# Patient Record
Sex: Male | Born: 1980 | Race: White | Hispanic: No | Marital: Single | Smoking: Current every day smoker
Health system: Southern US, Community
[De-identification: ages and names within clinical notes are randomized; demographics above are authoritative.]

## PROBLEM LIST (undated history)

## (undated) DIAGNOSIS — R4781 Slurred speech: Secondary | ICD-10-CM

## (undated) DIAGNOSIS — R569 Unspecified convulsions: Secondary | ICD-10-CM

## (undated) DIAGNOSIS — S069X9A Unspecified intracranial injury with loss of consciousness of unspecified duration, initial encounter: Secondary | ICD-10-CM

## (undated) DIAGNOSIS — F319 Bipolar disorder, unspecified: Secondary | ICD-10-CM

## (undated) DIAGNOSIS — S069XAA Unspecified intracranial injury with loss of consciousness status unknown, initial encounter: Secondary | ICD-10-CM

## (undated) HISTORY — PX: COSMETIC SURGERY: SHX468

## (undated) HISTORY — DX: Bipolar disorder, unspecified: F31.9

## (undated) HISTORY — PX: OTHER SURGICAL HISTORY: SHX169

## (undated) HISTORY — DX: Unspecified intracranial injury with loss of consciousness of unspecified duration, initial encounter: S06.9X9A

## (undated) HISTORY — DX: Unspecified intracranial injury with loss of consciousness status unknown, initial encounter: S06.9XAA

## (undated) HISTORY — DX: Slurred speech: R47.81

---

## 2002-02-10 DIAGNOSIS — F319 Bipolar disorder, unspecified: Secondary | ICD-10-CM

## 2002-02-10 HISTORY — PX: BRAIN SURGERY: SHX531

## 2002-02-10 HISTORY — DX: Bipolar disorder, unspecified: F31.9

## 2002-03-15 ENCOUNTER — Emergency Department (HOSPITAL_COMMUNITY): Admission: EM | Admit: 2002-03-15 | Discharge: 2002-03-15 | Payer: Self-pay | Admitting: Emergency Medicine

## 2002-05-07 ENCOUNTER — Encounter: Payer: Self-pay | Admitting: General Surgery

## 2002-05-07 ENCOUNTER — Encounter: Payer: Self-pay | Admitting: Neurosurgery

## 2002-05-07 ENCOUNTER — Inpatient Hospital Stay (HOSPITAL_COMMUNITY): Admission: AC | Admit: 2002-05-07 | Discharge: 2002-05-31 | Payer: Self-pay

## 2002-05-08 ENCOUNTER — Encounter: Payer: Self-pay | Admitting: Neurosurgery

## 2002-05-09 ENCOUNTER — Encounter: Payer: Self-pay | Admitting: General Surgery

## 2002-05-10 ENCOUNTER — Encounter: Payer: Self-pay | Admitting: Neurosurgery

## 2002-05-11 ENCOUNTER — Encounter: Payer: Self-pay | Admitting: General Surgery

## 2002-05-14 ENCOUNTER — Encounter: Payer: Self-pay | Admitting: General Surgery

## 2002-05-18 ENCOUNTER — Encounter: Payer: Self-pay | Admitting: General Surgery

## 2002-05-24 ENCOUNTER — Encounter: Payer: Self-pay | Admitting: General Surgery

## 2002-05-24 ENCOUNTER — Encounter: Payer: Self-pay | Admitting: Neurosurgery

## 2002-05-31 ENCOUNTER — Inpatient Hospital Stay (HOSPITAL_COMMUNITY)
Admission: RE | Admit: 2002-05-31 | Discharge: 2002-07-21 | Payer: Self-pay | Admitting: Physical Medicine & Rehabilitation

## 2002-06-03 ENCOUNTER — Encounter: Payer: Self-pay | Admitting: Physical Medicine & Rehabilitation

## 2002-06-05 ENCOUNTER — Encounter: Payer: Self-pay | Admitting: Physical Medicine & Rehabilitation

## 2002-06-06 ENCOUNTER — Encounter: Payer: Self-pay | Admitting: Physical Medicine & Rehabilitation

## 2002-06-22 ENCOUNTER — Encounter: Payer: Self-pay | Admitting: Physical Medicine & Rehabilitation

## 2002-07-18 ENCOUNTER — Encounter: Payer: Self-pay | Admitting: Physical Medicine & Rehabilitation

## 2002-07-26 ENCOUNTER — Encounter
Admission: RE | Admit: 2002-07-26 | Discharge: 2002-09-23 | Payer: Self-pay | Admitting: Physical Medicine & Rehabilitation

## 2002-08-23 ENCOUNTER — Encounter
Admission: RE | Admit: 2002-08-23 | Discharge: 2002-11-21 | Payer: Self-pay | Admitting: Physical Medicine & Rehabilitation

## 2002-09-02 ENCOUNTER — Encounter: Admission: RE | Admit: 2002-09-02 | Discharge: 2002-09-02 | Payer: Self-pay | Admitting: Neurosurgery

## 2002-09-02 ENCOUNTER — Encounter: Payer: Self-pay | Admitting: Neurosurgery

## 2002-12-06 ENCOUNTER — Encounter
Admission: RE | Admit: 2002-12-06 | Discharge: 2003-03-06 | Payer: Self-pay | Admitting: Physical Medicine & Rehabilitation

## 2003-02-17 ENCOUNTER — Ambulatory Visit (HOSPITAL_BASED_OUTPATIENT_CLINIC_OR_DEPARTMENT_OTHER): Admission: RE | Admit: 2003-02-17 | Discharge: 2003-02-17 | Payer: Self-pay | Admitting: Ophthalmology

## 2003-05-18 ENCOUNTER — Encounter
Admission: RE | Admit: 2003-05-18 | Discharge: 2003-08-16 | Payer: Self-pay | Admitting: Physical Medicine & Rehabilitation

## 2003-06-05 ENCOUNTER — Encounter: Admission: RE | Admit: 2003-06-05 | Discharge: 2003-09-03 | Payer: Self-pay

## 2003-08-17 ENCOUNTER — Encounter
Admission: RE | Admit: 2003-08-17 | Discharge: 2003-11-15 | Payer: Self-pay | Admitting: Physical Medicine & Rehabilitation

## 2003-11-20 ENCOUNTER — Ambulatory Visit: Payer: Self-pay | Admitting: Psychology

## 2003-11-20 ENCOUNTER — Encounter: Admission: RE | Admit: 2003-11-20 | Discharge: 2004-02-18 | Payer: Self-pay | Admitting: Psychology

## 2003-11-23 ENCOUNTER — Encounter
Admission: RE | Admit: 2003-11-23 | Discharge: 2004-01-22 | Payer: Self-pay | Admitting: Physical Medicine & Rehabilitation

## 2003-11-27 ENCOUNTER — Ambulatory Visit: Payer: Self-pay | Admitting: Physical Medicine & Rehabilitation

## 2003-12-21 ENCOUNTER — Ambulatory Visit: Payer: Self-pay | Admitting: Psychology

## 2004-01-11 ENCOUNTER — Ambulatory Visit: Payer: Self-pay | Admitting: Family Medicine

## 2004-01-18 ENCOUNTER — Ambulatory Visit: Payer: Self-pay | Admitting: Psychology

## 2004-01-22 ENCOUNTER — Encounter
Admission: RE | Admit: 2004-01-22 | Discharge: 2004-04-21 | Payer: Self-pay | Admitting: Physical Medicine & Rehabilitation

## 2004-01-24 ENCOUNTER — Ambulatory Visit: Payer: Self-pay | Admitting: Physical Medicine & Rehabilitation

## 2004-02-22 ENCOUNTER — Ambulatory Visit: Payer: Self-pay | Admitting: Family Medicine

## 2004-04-10 ENCOUNTER — Ambulatory Visit: Payer: Self-pay | Admitting: Family Medicine

## 2004-04-16 ENCOUNTER — Encounter
Admission: RE | Admit: 2004-04-16 | Discharge: 2004-07-15 | Payer: Self-pay | Admitting: Physical Medicine & Rehabilitation

## 2004-04-16 ENCOUNTER — Ambulatory Visit: Payer: Self-pay | Admitting: Physical Medicine & Rehabilitation

## 2004-05-22 ENCOUNTER — Ambulatory Visit: Payer: Self-pay | Admitting: Family Medicine

## 2004-05-29 ENCOUNTER — Ambulatory Visit: Payer: Self-pay | Admitting: Physical Medicine & Rehabilitation

## 2004-07-15 ENCOUNTER — Ambulatory Visit: Payer: Self-pay | Admitting: Family Medicine

## 2004-07-18 ENCOUNTER — Encounter: Admission: RE | Admit: 2004-07-18 | Discharge: 2004-10-16 | Payer: Self-pay | Admitting: Psychology

## 2004-07-18 ENCOUNTER — Ambulatory Visit: Payer: Self-pay | Admitting: Psychology

## 2004-09-12 ENCOUNTER — Ambulatory Visit: Payer: Self-pay | Admitting: Family Medicine

## 2004-09-23 ENCOUNTER — Ambulatory Visit: Payer: Self-pay | Admitting: Physical Medicine & Rehabilitation

## 2004-09-23 ENCOUNTER — Encounter
Admission: RE | Admit: 2004-09-23 | Discharge: 2004-12-22 | Payer: Self-pay | Admitting: Physical Medicine & Rehabilitation

## 2005-03-16 ENCOUNTER — Emergency Department (HOSPITAL_COMMUNITY): Admission: EM | Admit: 2005-03-16 | Discharge: 2005-03-16 | Payer: Self-pay | Admitting: Emergency Medicine

## 2005-04-30 ENCOUNTER — Ambulatory Visit: Payer: Self-pay | Admitting: Family Medicine

## 2005-06-04 ENCOUNTER — Emergency Department (HOSPITAL_COMMUNITY): Admission: EM | Admit: 2005-06-04 | Discharge: 2005-06-04 | Payer: Self-pay | Admitting: Emergency Medicine

## 2007-02-08 ENCOUNTER — Ambulatory Visit: Payer: Self-pay | Admitting: Family Medicine

## 2007-02-11 ENCOUNTER — Encounter: Payer: Self-pay | Admitting: Family Medicine

## 2007-05-11 ENCOUNTER — Ambulatory Visit: Payer: Self-pay | Admitting: Family Medicine

## 2007-05-19 DIAGNOSIS — F17200 Nicotine dependence, unspecified, uncomplicated: Secondary | ICD-10-CM | POA: Insufficient documentation

## 2007-05-19 DIAGNOSIS — R259 Unspecified abnormal involuntary movements: Secondary | ICD-10-CM | POA: Insufficient documentation

## 2007-05-19 DIAGNOSIS — F329 Major depressive disorder, single episode, unspecified: Secondary | ICD-10-CM

## 2007-05-19 DIAGNOSIS — E669 Obesity, unspecified: Secondary | ICD-10-CM

## 2007-05-19 DIAGNOSIS — F909 Attention-deficit hyperactivity disorder, unspecified type: Secondary | ICD-10-CM | POA: Insufficient documentation

## 2007-05-19 DIAGNOSIS — I10 Essential (primary) hypertension: Secondary | ICD-10-CM

## 2007-08-19 ENCOUNTER — Encounter: Payer: Self-pay | Admitting: Family Medicine

## 2007-08-19 LAB — CONVERTED CEMR LAB
BUN: 13 mg/dL (ref 6–23)
Chloride: 106 meq/L (ref 96–112)
Cholesterol: 178 mg/dL (ref 0–200)
Eosinophils Absolute: 0.2 10*3/uL (ref 0.0–0.7)
Eosinophils Relative: 3 % (ref 0–5)
HCT: 46 % (ref 39.0–52.0)
Hemoglobin: 14.7 g/dL (ref 13.0–17.0)
LDL Cholesterol: 80 mg/dL (ref 0–99)
MCHC: 32 g/dL (ref 30.0–36.0)
MCV: 91.1 fL (ref 78.0–100.0)
Monocytes Relative: 16 % — ABNORMAL HIGH (ref 3–12)
Neutro Abs: 3.6 10*3/uL (ref 1.7–7.7)
RBC: 5.05 M/uL (ref 4.22–5.81)
Sodium: 140 meq/L (ref 135–145)
Total CHOL/HDL Ratio: 7.1
Triglycerides: 363 mg/dL — ABNORMAL HIGH (ref <150)
VLDL: 73 mg/dL — ABNORMAL HIGH (ref 0–40)

## 2007-09-09 ENCOUNTER — Ambulatory Visit: Payer: Self-pay | Admitting: Family Medicine

## 2007-09-19 ENCOUNTER — Emergency Department (HOSPITAL_COMMUNITY): Admission: EM | Admit: 2007-09-19 | Discharge: 2007-09-19 | Payer: Self-pay | Admitting: Emergency Medicine

## 2007-09-24 ENCOUNTER — Emergency Department (HOSPITAL_COMMUNITY): Admission: EM | Admit: 2007-09-24 | Discharge: 2007-09-24 | Payer: Self-pay | Admitting: Emergency Medicine

## 2010-03-12 NOTE — Letter (Signed)
Summary: RPC chart  RPC chart   Imported By: Curtis Sites 09/18/2009 11:19:23  _____________________________________________________________________  External Attachment:    Type:   Image     Comment:   External Document
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
## 2010-06-28 NOTE — Op Note (Signed)
NAME:  Justin Russo, Justin Russo                         ACCOUNT NO.:  [id number]   MEDICAL RECORD NO.:  [id number]                   PATIENT TYPE:  AMB   LOCATION:  DSC                                  FACILITY:  [facility]   PHYSICIAN:  Pasty Spillers. Maple Hudson, M.D.              DATE OF BIRTH:  1980-09-14   DATE OF PROCEDURE:  DATE OF DISCHARGE:                                 OPERATIVE REPORT   PREOPERATIVE DIAGNOSIS:  Exotropia.   POSTOPERATIVE DIAGNOSIS:  Exotropia.   PROCEDURE:  1. Lateral rectus muscle recession, 10.5 mm O.S.  2. Medial rectus muscle resection, 7.5 mm O.S.   SURGEON:  Pasty Spillers. Maple Hudson, M.D.   ANESTHESIA:  General (laryngeal mask)   COMPLICATIONS:  None.   DESCRIPTION OF PROCEDURE:  After routine preoperative evaluation including  informed consent from the patient and parent, the patient was taken to the  operating room where he was identified immediately.  General anesthesia  introduced without difficulty.  After placement of the appropriate monitors,  the patient was prepped and draped in a sterile fashion. A lid speculum was  placed in the left eye.   Through a mid _____________ temporal fornix incision through conjunctiva and  Tenon fascia the left lateral rectus muscle was engaged on a series of  muscles hooks and carefully cleared of its surrounding fascial attachments.  The tendon was secured with a double-armed 6-0 Vicryl suture and with a  double-lock divided each border of the muscle, 1 mm from the insertion. The  muscle was disinserted. It was reattached to the sclera in a measured  distance of 10.5 mm posterior to the original insertion, using direct  scleral passage in crossed-swords fashion.  The suture ends were tied  securely after the position of the muscle had been checked and found to be  accurate.  Conjunctiva was closed with 2 interrupted 6-0 Vicryl sutures.   Through an inferonasal fornix incision through conjunctiva and Tenon fascia  the left medial  rectus muscle was identified, engaged, and cleared as  described for the lateral.  The muscle was spread between 2 self-retaining  hooks.  A 2-mm bite was taken of the _________ muscle belly in a measured  distance of 7.5 mm posterior to the insertion.  The knot was tied securely  at this location.  The needle in each end of the double-armed suture was  passed from the center of the muscle belly to the periphery, 7.5 mm  posterior to the insertion, and a double-lock divider was placed at each  border of the muscle.  A resection clamp was placed on the muscle just  anterior to these sutures.  The muscle was disinserted.  Each pole suture  was passed posteriorly-to-anteriorly through the periphery of the stump and  then anteriorly-to-posteriorly near the center of the stump, and then  posteriorly-to-anteriorly through the center of the muscle belly, just  posteriorly to the briefly placed  knot.  The muscle was drawn up to the  level of the original insertion and all slack was removed before the suture  ends were tied securely.   The conjunctiva was closed with 2 interrupted 6-0 Vicryl sutures.  TobraDex  ointment was placed in the eye.  The patient was awakened without difficulty  and taken to the recovery room in stable condition having suffered no  intraoperative nor immediately postoperative complications.                                               Pasty Spillers. Maple Hudson, M.D.    Cheron Schaumann  D:  02/17/2003  T:  02/17/2003  Job:  [id number]
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
## 2010-06-28 NOTE — Assessment & Plan Note (Signed)
Justin Russo is back regarding his brain injury and behavioral dyscontrol  syndrome.  We switched him over to valproic acid which seems to have worked  nicely to control his mood.  It is probably the best it has been since he  has been with [place].  He has had some problems with further gaining weight.  He  denies any significant pain.  He occasionally will  have a headache.  [name]  has questions about driving at this point and the driving is somewhat he  really wants to resume.  He did have an episode where he ran out of  medications a week or so ago, but it turned out that the pharmacy had given  them the wrong number of pills.   SOCIAL HISTORY:  The patient continues to live with his parents.  He does  smoke periodically.   REVIEW OF SYSTEMS:  The patient reports shortness of breath, coughing,  problems with mood and sleep at times, but this has improved.  Hesitation is  down.  Does report  reflux and heartburn occasionally.   PHYSICAL EXAMINATION:  VITAL SIGNS:  Blood pressure is 136/76, pulse is 75,  respiratory rate is 18, saturating 97% on room air.  NEUROLOGIC:  The patient walks with normal gait. Affect is bright and  appropriate.  Appearance is well-kept.  He has gained some weight.  He is  much more controlled in his demeanor and asks appropriate questions today.  Gaze is fairly conjugate.  His cranial nerve exam is stable and  unremarkable.  Sensory exam is 2/2 and motor exam is 5/5.  HEART:  Regular rate and rhythm.  LUNGS:  Clear.  ABDOMEN:  Soft and nontender.   ASSESSMENT:  1.  Traumatic brain injury.  2.  Behavioral dyscontrol syndrome.  3.  History of gaze deviation.  4.  Myoclonus.   PLAN:  1.  Taking Risperdal 1 mg t.i.d.  2.  Continue valproic acid 250 mg t.i.d.  We will check low LFTs and      valproic acid level today.  3.  Continue Restoril 30 mg for sleep.  4.  Protonix for reflux.  5.  The patient is not given permission to drive at this point.  We will let     him do some trial runs with his family if they would like in secluded      areas.  He is too impulsive in attention still to allow him to drive at      this point.      ZTS/MedQ  D:  02/26/2004 14:13:50  T:  02/26/2004 14:31:17  Job #:  [id number]

## 2010-06-28 NOTE — Assessment & Plan Note (Signed)
HISTORY:  The patient is back regarding his TBI and behavioral dyscontrol.  He has done poorly after coming off the Risperdal.  He remains on  propranolol 20 mg daily and 1000 mg total of Tegretol, in addition to his  Prozac 40 mg daily.  The parents note impulsive behavior with aggressive  activity.  He is very distractible.  He acts very child-like at times and  aggressive at others.  They did not call regarding these concerns to the  office, however, and waited to discuss them today.  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  He has no suicidal thoughts.  His mother has been  concerned for her well-being, however, at times due to his impulsive and  aggressive behavior.  They are essentially afraid to bring him out in public  due to the mood swings.   REVIEW OF SYSTEMS:  The patient denies any chest pain, shortness of breath,  cold, flu, wheezing, or coughing symptoms.  The patient denies weakness,  numbness, spasms, strokes, confusion, problems with sleep or headaches.  He  does report some suicidal thoughts previously.  He denies nausea, vomiting,  constipation, bowel or bladder incontinence.  He denies fever, chills,  weight changes, or bruising.   PHYSICAL EXAMINATION:  VITAL SIGNS:  Blood pressure 95/51, pulse 66.  GENERAL:  He is walking with a limp.  He is generally alert, with a normal  appearance.  NEUROLOGIC:  He had good balance and coordination today for the most part.  Strength is 5/5.  Gaze is more conjugate than in the past.  Sensory  examination is normal.  Cranial nerve examination is stable.  The patient  was very impulsive today, continually interrupting conservation, and at  times speaking inappropriately and child-like.  He was redirectable,  however.   ASSESSMENT:  1. Status post traumatic brain injury.  2. Behavioral dyscontrol syndrome.  3. History of gaze deviation.  4.  Myoclonus and dystonia.   PLAN:  1. We will restart Risperdal at 0.25 mg q.12h., increasing up to 0.5 mg     q.12h. after one week, if this is not beneficial.  Hopefully this will     better control his mood swings.  He may need to stay on this long-term.  2. The patient will continue with Prozac, propranolol and Tegretol for now.     Consider changing the Tegretol at some point, depending upon his response     to the Risperdal.  3. Will continue the propranolol for his tremor at this point, as this has     been effective here.  4. Will continue his Prozac for now, but may consider another agent down the     line.  5. I will see the patient back in about one month's time.      Ranelle Oyster, M.D.   ZTS/MedQ  D:  08/21/2003 14:59:52  T:  08/21/2003 15:36:28  Job #:  [id number]

## 2010-06-28 NOTE — Assessment & Plan Note (Signed)
MEDICAL RECORD NUMBER:  [id number]   Justin Russo is here in followup of his traumatic brain injury. The last time I  saw him he had increased sleepiness with lethargy with tremors. We did a lab  workup at that time which revealed normal liver function.  His valproic acid  level was actually a bit low at 39.2. Urine drug screen was positive for  marijuana. CBC was normal. The patient comes in stating that he feels better  after we decrease his Risperdal a bit. We discussed his drug habits and the  patient states that he smokes about a 1/4 bag of weed every day. He gets the  drugs from his friends. He pays for the drugs from an allowance his mother  gives him. His mother states that she was planning to talk about the drug  today.  He remains on propranolol 20 mg b.i.d., Risperdal 0.5 mg t.i.d.,  Restoril 30 mg q.h.s., Prozac 60 mg q.d. and Depakote 250 mg t.i.d.  Protonix 40 mg q.d.   REVIEW OF SYMPTOMS:  The patient notes intermittent tremors. He still is  lethargic but is more alert. He denies any nausea, vomiting, diarrhea,  constipation, bowel or bladder incontinence. Headaches are few and far  between. Sleep is fair to normal.   SOCIAL HISTORY:  The patient's drug habits are mentioned above. The patient  states he really has not goal or direction at this point in his life.   PHYSICAL EXAMINATION:  The patient's alert, vital signs are stable. He is  afebrile.  The patient's gait is slightly wide based. He tends to lean  posteriorly. Affect remains flat although he is more arousable and focused  today. Appearance is fairly well kept. He has intermittent essential and  intentional tremor. Strength is 5/5, reflexes are decreased. Sensory exam is  fair to compared to normal. Visual exam and gaze are fair with some  disconjugate movement noted.   ASSESSMENT:  1.  Traumatic brain injury.  2.  Behavioral dyscontrol.  3.  History of gaze deviation.  4.  Myoclonus.  5.  Marijuana abuse.   PLAN:  1.  We had a long heart to heart talk regarding his drug use. We discussed      the ramifications of using marijuana in general let alone in the setting      of antipsychotic medications and his severe traumatic brain injury. I      will make a referral to outpatient behavioral health for drug evaluation      and treatments. I think he could be treated on an outpatient basis but      he may require an inpatient program. I am not sure this option is      available to him with his Medicaid. We will check his urine in three      months when I see him back in followup. If he tests positive again for      marijuana, we will discharge him from the clinic. The patient was warned      as well as his mother.  2.  I refilled his protonix today at 40 mg q.d. and Restoril 30 mg q.h.s.      He will remain on Depakote 250 mg t.i.d., Risperdal 0.5 mg t.i.d. and      Prozac 50 mg q.d. with the propranolol being at 20 mg b.i.d.  3.  I will see the patient back in three months as mentioned above.     ZTS/MedQ  D:  05/29/2004 11:36:07  T:  05/29/2004 12:08:33  Job #:  [id number]
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
## 2010-06-28 NOTE — Op Note (Signed)
NAME:  Justin Russo, Justin Russo                         ACCOUNT NO.:  [id number]   MEDICAL RECORD NO.:  [id number]                   PATIENT TYPE:  INP   LOCATION:  3114                                 FACILITY:  [facility]   PHYSICIAN:  Jimmye Norman III, M.D.               DATE OF BIRTH:  18-Dec-1980   DATE OF PROCEDURE:  05/16/2002  DATE OF DISCHARGE:                                 OPERATIVE REPORT   PREOPERATIVE DIAGNOSIS:  Severe closed head injury with LeFort 2 and 3  fractures of the mid face.   POSTOPERATIVE DIAGNOSIS:  Severe closed head injury with LeFort 2 and 3  fractures of the mid face, requiring ventilatory and nutritional support  long-term.   PROCEDURE:  1. Percutaneous endoscopic  gastrostomy tube.  2. Percutaneous tracheostomy with a #8 Shiley tracheostomy tube.   SURGEON:  Jimmye Norman, M.D.   ASSISTANT:  Gabrielle Dare. Janee Morn, M.D.   ANESTHESIA:  General endotracheal anesthesia.   ESTIMATED BLOOD LOSS:  Less than  20 cc.   COMPLICATIONS:  None, condition stable.   DISPOSITION:  He is going to go back to the ICU directly after he has had  open reduction and internal fixation of his LeFort fractures. That will be  separately dictated by Dr. Alita Chyle.   DESCRIPTION OF PROCEDURE:  The patient was brought to the operating room and  placed in the supine position on the operating table. After he was given an  adequate amount of IV sedation and paralyzation. He was  prepped  for the  percutaneous endoscopic gastrostomy tube initially.   The Olympus endoscope pediatric size was passed into the oropharynx down  through the patient's esophagus and into the proximal stomach. There was a  tube feeding bezoar in the proximal stomach, however, we were able to see  the anterior abdominal wall clearly, through which a 14 gauge angiocath was  passed and subsequently a looped wire passed through the angiocath through  the snare that had been passed through the endoscope and brought  out through  the patient's mouth.   Once we had done this we looped the pullthrough gastrostomy tube which was a  24 [place] size around the wire and pulled it back through the patient's  mouth out the anterior abdominal wall and secured it in place in the usual  standard manner. We re-endoscoped the patient to confirm anterior gastric  positioning of the gastrostomy tube bolster, and pictures were taken to  confirm this. Once this was done and the G-tube was in place, we  subsequently placed the towel roll and repositioned the shoulder in order to  prepare him for the tracheostomy tube.   About 1 cm above the sternal notch a transverse incision was made about 2 cm  in size down into the subcu. Electrocautery was used to dissect down through  the subcu. We then bluntly dissected down through the pretracheal fascia  using hemostat clamps and making sure that the strap muscles were split down  the middle.   We kept the trachea in the midline as we subsequently penetrated the second  to third tracheal rings with the angiocatheter and subsequently passed a  wire from the [place] percutaneous tracheostomy tube down into the  trachea. It was confirmed to be in position by a bronchoscopy performed by  the anesthesiologist.   Once this was done a rigid short dilator was passed over the wire into the  tracheotomy and subsequently the [facility] Rhino large dilator was passed over  the wire and introducer into the tracheotomy enlarging it to the proper  size. This allowed for [place] to subsequently pass a #8 Shiley catheter  introduced in the tracheotomy and then down into position. Once this was  done the balloon was inflated. It was secured in place with 3-0 nylon  sutures in 4 corners.   Once  this was completed and a dressing was in place, the patient was  maintained in the operating room for fixation of  his facial fracture by Dr.  Alita Chyle. All sponge, instrument and needle counts were  correct. The  patient was left in the operating room.                                               Kathrin Ruddy, M.D.    JW/MEDQ  D:  05/16/2002  T:  05/16/2002  Job:  [id number]

## 2010-06-28 NOTE — Assessment & Plan Note (Signed)
HISTORY:  Justin Russo is here in followup of his brain injury.  The mother notes  that over the last two weeks or so that he has become more sleepy and  lethargic. She notes tremors.  He has had more double vision.  He continues  to smoke cigarettes.  He does some volunteer work at the [employer], but  does not like the work.  There have been no changes in medications.  The  patient and his mother state that he has been taking his medications as I  have prescribed them.   MEDICATIONS:  1.  The patient is on Depakote 250 mg t.i.d.  2.  Prozac 60 mg daily.  3.  Protonix daily.  4.  Restoril 30 mg q.h.s.  5.  Risperdal 1 mg t.i.d.  6.  Propranolol 20 mg b.i.d.   SOCIAL HISTORY:  Pertinent issues are mentioned above.   REVIEW OF SYSTEMS:  The patient reports fair sleep.  Hearing is impaired.  He has remote spasms.  He has a few headaches.  He denies any nausea,  vomiting, diarrhea, constipation, bowel or bladder incontinence.   PHYSICAL EXAMINATION:  VITAL SIGNS:  Blood pressure 120/64, pulse 70,  respirations 18, saturation 97% on room air.  NEUROLOGIC:  The patient's gait is slightly wide-based.  His affect is flat  and slow.  He is very slow to process today.  His appearance is fairly well  kempt.  The patient has a tremor both with rest and with movement today.  The tongue shakes as well.  He is able to follow most commands; however,  strength is 5/5.  Reflexes are decreased.  Sensory exam is intact.  Cranial  nerve exam reveals decreased acuity and double vision, particularly on gaze  to the left side.  His gaze is still disconjugate but without significant  change from prior visit.   ASSESSMENT:  1.  Traumatic brain injury.  2.  Behavioral dyscontrol.  3.  History of gaze deviation.  4.  Myoclonus.  5.  New tremors, most consistent with dyskinesias.  These are likely related      to his Risperdal.   PLAN:  1.  Will decrease Risperdal to 0.5 mg t.i.d.  We may have to  discontinue      this entirely, but due to his chronic behavioral difficulties, I would      like to see how Justin Russo does with this slightly reduced dose.  An option      would be to titrate his valproic acid level higher.  2.  Check a valproic acid level today, as well as CBC, complete metabolic      panel and urine drug screen.  3.  He may continue his medications as dosed for now, except for the      Risperdal as mentioned above.  4.  I will see the patient back in approximately one month's time.     ZTS/MedQ  D:  04/16/2004 12:42:13  T:  04/16/2004 13:33:43  Job #:  [id number]

## 2010-06-28 NOTE — Assessment & Plan Note (Signed)
HISTORY OF PRESENT ILLNESS:  Justin Russo is back regarding his brain injury and  behavioral dyscontrol syndrome.  He is still having some spells where he  becomes agitated and unruly.  These seem to be happening more during the  p.m. hours.  He has improved overall, but he is not where he wants to be.  He maintains on Tegretol 1000 mg a day.  Sleep is fair.  Justin Russo has gained  some weight.  He does smoke some cigarettes now.  He smokes up to half pack  per day.   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   REVIEW OF SYSTEMS:  The patient denies any chest pain.  Does report  occasional shortness of breath and coughing.  He does report acid reflux  which has been chronic but seems to be worsened of late.  He does report  occasional depression, blurred vision, agitation, suicidal thoughts.  Positive weight gain as noted as well as occasionally high blood sugars.   PHYSICAL EXAMINATION:  VITAL SIGNS:  Blood pressure 119/68, pulse 74,  respirations 20, saturating 98% on room air.  GENERAL APPEARANCE:  Gait is normal.  Affect is fair.  He remains a bit  loose with his affect, but this is mostly appropriate.  He can be impulsive  at times.  Appearance is well kept.  NEUROLOGICAL:  Strength is 5/5.  Sensory exam is intact as well as reflexes.  Cranial nerve exam is generally stable.  His gaze remains slightly  disconjugate, but he denies double vision.   ASSESSMENT:  1.  Traumatic brain injury.  2.  Behavioral dyscontrol syndrome.  3.  History of gaze deviation.  4.  Myoclonus.   PLAN:  1.  Will continue Risperdal at 1 mg t.i.d.  2.  Will transition all Tegretol to valproic acid.  Taper instruction sheet      was written.  We will workup towards 250 mg t.i.d. of valproic acid.      Will continue his Restoril at 30 mg q.h.s. for sleep.  3.  I gave the patient a prescription for Protonix for his reflux.  4.  Will see the  patient back in about a months time.  He will need LFT's      and valproic acid level at that visit.      Justin Russo   ZTS/MedQ  D:  01/24/2004 10:31:33  T:  01/24/2004 10:58:16  Job #:  [id number]
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
## 2010-10-10 ENCOUNTER — Emergency Department (HOSPITAL_COMMUNITY): Payer: Medicare Other

## 2010-10-10 ENCOUNTER — Emergency Department (HOSPITAL_COMMUNITY)
Admission: EM | Admit: 2010-10-10 | Discharge: 2010-10-10 | Disposition: A | Discharge status: Home / Self Care | Payer: Medicare Other | Attending: Emergency Medicine | Admitting: Emergency Medicine

## 2010-10-10 DIAGNOSIS — W2209XA Striking against other stationary object, initial encounter: Secondary | ICD-10-CM | POA: Insufficient documentation

## 2010-10-10 DIAGNOSIS — M79609 Pain in unspecified limb: Secondary | ICD-10-CM | POA: Insufficient documentation

## 2010-10-10 DIAGNOSIS — S92309A Fracture of unspecified metatarsal bone(s), unspecified foot, initial encounter for closed fracture: Secondary | ICD-10-CM | POA: Insufficient documentation

## 2010-10-10 DIAGNOSIS — M7989 Other specified soft tissue disorders: Secondary | ICD-10-CM | POA: Insufficient documentation

## 2010-10-10 DIAGNOSIS — S6990XA Unspecified injury of unspecified wrist, hand and finger(s), initial encounter: Secondary | ICD-10-CM | POA: Insufficient documentation

## 2011-03-25 ENCOUNTER — Telehealth: Payer: Self-pay | Admitting: Family Medicine
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
## 2011-03-25 NOTE — Telephone Encounter (Signed)
He needs an appt here has not been seen for years

## 2011-03-27 ENCOUNTER — Telehealth: Payer: Self-pay | Admitting: Family Medicine

## 2011-03-27 NOTE — Telephone Encounter (Signed)
Will need note from his last neurologist to even see what he was being treated for. I have not seen this pt for over 3 years, he needs a n appt first, which would be like a new pt if he has not been here in 3 years

## 2011-03-28 ENCOUNTER — Telehealth: Payer: Self-pay | Admitting: Family Medicine

## 2011-03-28 NOTE — Telephone Encounter (Signed)
Pt has appt with dr. Jeanice Lim

## 2011-03-28 NOTE — Telephone Encounter (Signed)
Spoke directly with patient's father who states the symptoms were 37 days old, of slurred speech and localized unilateral weakness, I advised he take him to the ED for further evaluation today, he agreed

## 2011-03-28 NOTE — Telephone Encounter (Signed)
Left message for Justin Russo 

## 2011-03-31 ENCOUNTER — Ambulatory Visit (INDEPENDENT_AMBULATORY_CARE_PROVIDER_SITE_OTHER): Payer: Medicare Other | Admitting: Family Medicine

## 2011-03-31 ENCOUNTER — Encounter: Payer: Self-pay | Admitting: Family Medicine

## 2011-03-31 VITALS — BP 130/84 | HR 78 | Resp 16 | Ht 74.25 in | Wt 156.4 lb

## 2011-03-31 DIAGNOSIS — I10 Essential (primary) hypertension: Secondary | ICD-10-CM

## 2011-03-31 DIAGNOSIS — R0989 Other specified symptoms and signs involving the circulatory and respiratory systems: Secondary | ICD-10-CM | POA: Diagnosis not present

## 2011-03-31 DIAGNOSIS — F3289 Other specified depressive episodes: Secondary | ICD-10-CM

## 2011-03-31 DIAGNOSIS — F17200 Nicotine dependence, unspecified, uncomplicated: Secondary | ICD-10-CM

## 2011-03-31 DIAGNOSIS — R4789 Other speech disturbances: Secondary | ICD-10-CM | POA: Diagnosis not present

## 2011-03-31 DIAGNOSIS — E785 Hyperlipidemia, unspecified: Secondary | ICD-10-CM | POA: Diagnosis not present

## 2011-03-31 DIAGNOSIS — F319 Bipolar disorder, unspecified: Secondary | ICD-10-CM | POA: Insufficient documentation

## 2011-03-31 DIAGNOSIS — R4781 Slurred speech: Secondary | ICD-10-CM

## 2011-03-31 DIAGNOSIS — F329 Major depressive disorder, single episode, unspecified: Secondary | ICD-10-CM | POA: Diagnosis not present

## 2011-03-31 DIAGNOSIS — E669 Obesity, unspecified: Secondary | ICD-10-CM | POA: Diagnosis not present

## 2011-03-31 DIAGNOSIS — E559 Vitamin D deficiency, unspecified: Secondary | ICD-10-CM | POA: Diagnosis not present

## 2011-03-31 DIAGNOSIS — Z79899 Other long term (current) drug therapy: Secondary | ICD-10-CM | POA: Diagnosis not present

## 2011-03-31 DIAGNOSIS — M810 Age-related osteoporosis without current pathological fracture: Secondary | ICD-10-CM | POA: Diagnosis not present

## 2011-03-31 DIAGNOSIS — D649 Anemia, unspecified: Secondary | ICD-10-CM | POA: Diagnosis not present

## 2011-03-31 NOTE — Assessment & Plan Note (Signed)
Unchanged, no plan to quit 
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
## 2011-03-31 NOTE — Assessment & Plan Note (Signed)
Managed by psychiatry 

## 2011-03-31 NOTE — Patient Instructions (Addendum)
F/u in  6months.  You are being referred to neurology office that you have been to before for new slurred  speech  Cbc, chem 7, lipid, tSH and vit D today , non fasting  Please cut back on cigarretes

## 2011-03-31 NOTE — Assessment & Plan Note (Signed)
Carotid doppler to evaluate possible bruit also recent h/o speech disturbance and unilateral numbness
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
## 2011-03-31 NOTE — Assessment & Plan Note (Signed)
Controlled, no change in medication  

## 2011-04-01 ENCOUNTER — Ambulatory Visit: Payer: Medicare Other | Admitting: Family Medicine

## 2011-04-01 LAB — LIPID PANEL
Cholesterol: 168 mg/dL (ref 0–200)
HDL: 35 mg/dL — ABNORMAL LOW (ref >39)
LDL Cholesterol: 62 mg/dL (ref 0–99)
Triglycerides: 354 mg/dL — ABNORMAL HIGH (ref <150)

## 2011-04-01 LAB — BASIC METABOLIC PANEL
BUN: 16 mg/dL (ref 6–23)
CO2: 26 mEq/L (ref 19–32)
Potassium: 4.5 mEq/L (ref 3.5–5.3)
Sodium: 141 mEq/L (ref 135–145)

## 2011-04-01 LAB — VITAMIN D 25 HYDROXY (VIT D DEFICIENCY, FRACTURES): Vit D, 25-Hydroxy: 28 ng/mL — ABNORMAL LOW (ref 30–89)

## 2011-04-01 LAB — CBC
HCT: 47 % (ref 39.0–52.0)
MCH: 29 pg (ref 26.0–34.0)
MCV: 89 fL (ref 78.0–100.0)

## 2011-04-03 DIAGNOSIS — G40109 Localization-related (focal) (partial) symptomatic epilepsy and epileptic syndromes with simple partial seizures, not intractable, without status epilepticus: Secondary | ICD-10-CM | POA: Diagnosis not present

## 2011-04-03 DIAGNOSIS — Z87820 Personal history of traumatic brain injury: Secondary | ICD-10-CM | POA: Diagnosis not present

## 2011-04-03 DIAGNOSIS — R259 Unspecified abnormal involuntary movements: Secondary | ICD-10-CM | POA: Diagnosis not present

## 2011-04-08 DIAGNOSIS — G40109 Localization-related (focal) (partial) symptomatic epilepsy and epileptic syndromes with simple partial seizures, not intractable, without status epilepticus: Secondary | ICD-10-CM | POA: Diagnosis not present

## 2011-04-08 DIAGNOSIS — R259 Unspecified abnormal involuntary movements: Secondary | ICD-10-CM | POA: Diagnosis not present

## 2011-04-10 ENCOUNTER — Ambulatory Visit (HOSPITAL_COMMUNITY)
Admission: RE | Admit: 2011-04-10 | Discharge: 2011-04-10 | Disposition: A | Discharge status: Home / Self Care | Payer: Medicare Other | Source: Ambulatory Visit | Attending: Family Medicine | Admitting: Family Medicine

## 2011-04-10 ENCOUNTER — Telehealth: Payer: Self-pay | Admitting: Family Medicine

## 2011-04-10 ENCOUNTER — Other Ambulatory Visit: Payer: Self-pay

## 2011-04-10 DIAGNOSIS — R0989 Other specified symptoms and signs involving the circulatory and respiratory systems: Secondary | ICD-10-CM | POA: Diagnosis not present

## 2011-04-10 NOTE — Progress Notes (Signed)
VASCULAR LAB PRELIMINARY  PRELIMINARY  PRELIMINARY  PRELIMINARY  Carotid duplex  completed.    Preliminary report:  Bilateral:  No evidence of hemodynamically significant internal carotid artery stenosis.   Vertebral artery flow is antegrade.      Terance Hart, RVT 04/10/2011, 11:55 AM

## 2011-04-10 NOTE — Telephone Encounter (Signed)
Toni Amend found and ordered the correct test

## 2011-04-16 DIAGNOSIS — R259 Unspecified abnormal involuntary movements: Secondary | ICD-10-CM | POA: Diagnosis not present

## 2011-04-16 DIAGNOSIS — G40109 Localization-related (focal) (partial) symptomatic epilepsy and epileptic syndromes with simple partial seizures, not intractable, without status epilepticus: Secondary | ICD-10-CM | POA: Diagnosis not present

## 2011-06-10 DIAGNOSIS — R259 Unspecified abnormal involuntary movements: Secondary | ICD-10-CM | POA: Diagnosis not present

## 2011-06-10 DIAGNOSIS — G40109 Localization-related (focal) (partial) symptomatic epilepsy and epileptic syndromes with simple partial seizures, not intractable, without status epilepticus: Secondary | ICD-10-CM | POA: Diagnosis not present

## 2011-06-10 DIAGNOSIS — Z87820 Personal history of traumatic brain injury: Secondary | ICD-10-CM | POA: Diagnosis not present

## 2011-06-26 ENCOUNTER — Ambulatory Visit: Payer: Medicare Other | Attending: Neurology

## 2011-06-26 DIAGNOSIS — R471 Dysarthria and anarthria: Secondary | ICD-10-CM | POA: Diagnosis not present

## 2011-06-26 DIAGNOSIS — IMO0001 Reserved for inherently not codable concepts without codable children: Secondary | ICD-10-CM | POA: Diagnosis not present

## 2011-07-02 ENCOUNTER — Ambulatory Visit: Payer: Medicare Other

## 2011-07-02 DIAGNOSIS — IMO0001 Reserved for inherently not codable concepts without codable children: Secondary | ICD-10-CM | POA: Diagnosis not present

## 2011-07-02 DIAGNOSIS — R471 Dysarthria and anarthria: Secondary | ICD-10-CM | POA: Diagnosis not present

## 2011-07-09 ENCOUNTER — Ambulatory Visit: Payer: Medicare Other

## 2011-07-09 DIAGNOSIS — IMO0001 Reserved for inherently not codable concepts without codable children: Secondary | ICD-10-CM | POA: Diagnosis not present

## 2011-07-09 DIAGNOSIS — R471 Dysarthria and anarthria: Secondary | ICD-10-CM | POA: Diagnosis not present

## 2011-07-16 ENCOUNTER — Ambulatory Visit: Payer: Medicare Other | Attending: Neurology

## 2011-07-16 DIAGNOSIS — IMO0001 Reserved for inherently not codable concepts without codable children: Secondary | ICD-10-CM | POA: Insufficient documentation

## 2011-07-16 DIAGNOSIS — R471 Dysarthria and anarthria: Secondary | ICD-10-CM | POA: Insufficient documentation

## 2011-07-28 DIAGNOSIS — F319 Bipolar disorder, unspecified: Secondary | ICD-10-CM | POA: Diagnosis not present

## 2011-09-06 ENCOUNTER — Encounter (HOSPITAL_COMMUNITY): Payer: Self-pay | Admitting: *Deleted

## 2011-09-06 ENCOUNTER — Emergency Department (HOSPITAL_COMMUNITY)
Admission: EM | Admit: 2011-09-06 | Discharge: 2011-09-06 | Disposition: A | Discharge status: Home / Self Care | Payer: Medicare Other | Attending: Emergency Medicine | Admitting: Emergency Medicine

## 2011-09-06 DIAGNOSIS — F17200 Nicotine dependence, unspecified, uncomplicated: Secondary | ICD-10-CM | POA: Diagnosis not present

## 2011-09-06 DIAGNOSIS — F319 Bipolar disorder, unspecified: Secondary | ICD-10-CM | POA: Diagnosis not present

## 2011-09-06 DIAGNOSIS — L03317 Cellulitis of buttock: Secondary | ICD-10-CM | POA: Diagnosis not present

## 2011-09-06 DIAGNOSIS — L0291 Cutaneous abscess, unspecified: Secondary | ICD-10-CM

## 2011-09-06 DIAGNOSIS — L0231 Cutaneous abscess of buttock: Secondary | ICD-10-CM | POA: Diagnosis not present

## 2011-09-06 HISTORY — DX: Unspecified convulsions: R56.9

## 2011-09-06 MED ORDER — LIDOCAINE HCL (PF) 1 % IJ SOLN: 5.0000 mL | Freq: Once | INTRAMUSCULAR | Status: DC

## 2011-09-06 MED ORDER — CEPHALEXIN 500 MG PO CAPS: 500.0000 mg | ORAL_CAPSULE | Freq: Four times a day (QID) | ORAL | Status: AC

## 2011-09-06 MED ORDER — OXYCODONE-ACETAMINOPHEN 5-325 MG PO TABS
1.0000 | ORAL_TABLET | Freq: Once | ORAL | Status: AC
  Administered 2011-09-06: 1 via ORAL
  Filled 2011-09-06: qty 1

## 2011-09-06 MED ORDER — OXYCODONE-ACETAMINOPHEN 5-325 MG PO TABS: 1.0000 | ORAL_TABLET | Freq: Four times a day (QID) | ORAL | Status: AC | PRN

## 2011-09-06 NOTE — ED Notes (Signed)
D/c instructions reviewed in depth with pt; pt understands in 2 days, he needs to have packing removed
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
## 2011-09-06 NOTE — ED Notes (Signed)
Pt states that he has a boil on right side of hip. Pt states that painful to sit. Pt states worse last night.

## 2011-09-08 NOTE — ED Provider Notes (Signed)
Medical screening examination/treatment/procedure(s) were performed by non-physician practitioner and as supervising physician I was immediately available for consultation/collaboration.   Joya Gaskins, MD 09/08/11 [phone]

## 2011-09-15 ENCOUNTER — Ambulatory Visit: Payer: Medicare Other | Admitting: Family Medicine

## 2011-11-17 DIAGNOSIS — Z79899 Other long term (current) drug therapy: Secondary | ICD-10-CM | POA: Diagnosis not present

## 2012-02-28 ENCOUNTER — Encounter (HOSPITAL_COMMUNITY): Payer: Self-pay | Admitting: Emergency Medicine

## 2012-02-28 ENCOUNTER — Emergency Department (HOSPITAL_COMMUNITY)
Admission: EM | Admit: 2012-02-28 | Discharge: 2012-02-28 | Disposition: A | Discharge status: Home / Self Care | Payer: Medicare Other | Attending: Emergency Medicine | Admitting: Emergency Medicine

## 2012-02-28 DIAGNOSIS — L0231 Cutaneous abscess of buttock: Secondary | ICD-10-CM | POA: Diagnosis not present

## 2012-02-28 DIAGNOSIS — Z87828 Personal history of other (healed) physical injury and trauma: Secondary | ICD-10-CM | POA: Insufficient documentation

## 2012-02-28 DIAGNOSIS — Z79899 Other long term (current) drug therapy: Secondary | ICD-10-CM | POA: Diagnosis not present

## 2012-02-28 DIAGNOSIS — F319 Bipolar disorder, unspecified: Secondary | ICD-10-CM | POA: Insufficient documentation

## 2012-02-28 DIAGNOSIS — G40909 Epilepsy, unspecified, not intractable, without status epilepticus: Secondary | ICD-10-CM | POA: Diagnosis not present

## 2012-02-28 DIAGNOSIS — R21 Rash and other nonspecific skin eruption: Secondary | ICD-10-CM | POA: Diagnosis not present

## 2012-02-28 DIAGNOSIS — L0291 Cutaneous abscess, unspecified: Secondary | ICD-10-CM

## 2012-02-28 DIAGNOSIS — L03317 Cellulitis of buttock: Secondary | ICD-10-CM | POA: Diagnosis not present

## 2012-02-28 DIAGNOSIS — F17200 Nicotine dependence, unspecified, uncomplicated: Secondary | ICD-10-CM | POA: Insufficient documentation

## 2012-02-28 LAB — GLUCOSE, CAPILLARY: Glucose-Capillary: 76 mg/dL (ref 70–99)

## 2012-02-28 MED ORDER — HYDROCODONE-ACETAMINOPHEN 5-500 MG PO TABS: 1.0000 | ORAL_TABLET | Freq: Four times a day (QID) | ORAL | Status: DC | PRN

## 2012-02-28 MED ORDER — SULFAMETHOXAZOLE-TRIMETHOPRIM 800-160 MG PO TABS: 1.0000 | ORAL_TABLET | Freq: Two times a day (BID) | ORAL | Status: DC

## 2012-02-28 MED ORDER — HYDROCODONE-ACETAMINOPHEN 5-325 MG PO TABS
2.0000 | ORAL_TABLET | Freq: Once | ORAL | Status: AC
  Administered 2012-02-28: 2 via ORAL
  Filled 2012-02-28: qty 2
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
## 2012-02-28 NOTE — ED Notes (Signed)
Pt ambulatory leaving ED by self. Pt instructed not to drive; pt states father is coming to pick him up from the ED. Pt given d/c teaching and prescriptions. Pt educated on wound care and when to follow up with urgent care for wound check. Pt verbalized understanding of d/c teaching and has no further questions upon d/c. Pt does not appear to be in acute distress upon d/c.

## 2012-02-28 NOTE — ED Provider Notes (Signed)
Medical screening examination/treatment/procedure(s) were performed by non-physician practitioner and as supervising physician I was immediately available for consultation/collaboration.   Lyanne Co, MD 02/28/12 [phone]

## 2012-02-28 NOTE — ED Notes (Signed)
Pt states noticed abscess about 2 months ago but went away; pt states abscess resurfaced about 3 days ago and has progressively gotten worse. Pt denies N/V/D. Pt denies feer/chills.

## 2012-02-28 NOTE — Discharge Instructions (Signed)
1. Medications: bactrim, vicodin, usual home medications 2. Treatment: rest, drink plenty of fluids, medications as prescribed 3. Follow Up: Please followup with your primary doctor for discussion of your diagnoses and further evaluation after today's visit; if you do not have a primary care doctor use the resource guide provided to find one; return to emergency department as needed or if symptoms worsen   Followup with your doctor or an urgent care for wound recheck in 48-72 hours. You may return to the emergency department if you have  a fever that persists greater than 101 or your abscess appears to become infected (growing surrounding redness and warmth). Do not operate any heavy machinery while on pain medications. Do not consume alcohol on these medications either.  Abscess An abscess (boil or furuncle) is an infected area that contains a collection of pus.  SYMPTOMS Signs and symptoms of an abscess include pain, tenderness, redness, or hardness. You may feel a moveable soft area under your skin. An abscess can occur anywhere in the body.  TREATMENT  A surgical cut (incision) may be made over your abscess to drain the pus. Gauze may be packed into the space or a drain may be looped through the abscess cavity (pocket). This provides a drain that will allow the cavity to heal from the inside outwards. The abscess may be painful for a few days, but should feel much better if it was drained.  Your abscess, if seen early, may not have localized and may not have been drained. If not, another appointment may be required if it does not get better on its own or with medications. HOME CARE INSTRUCTIONS   Only take over-the-counter or prescription medicines for pain, discomfort, or fever as directed by your caregiver.   Take your antibiotics as directed if they were prescribed. Finish them even if you start to feel better.   Keep the skin and clothes clean around your abscess.   If the abscess was  drained, you will need to use gauze dressing to collect any draining pus. Dressings will typically need to be changed 3 or more times a day.   The infection may spread by skin contact with others. Avoid skin contact as much as possible.   Practice good hygiene. This includes regular hand washing, cover any draining skin lesions, and do not share personal care items.   If you participate in sports, do not share athletic equipment, towels, whirlpools, or personal care items. Shower after every practice or tournament.   If a draining area cannot be adequately covered:   Do not participate in sports.   Children should not participate in day care until the wound has healed or drainage stops.   If your caregiver has given you a follow-up appointment, it is very important to keep that appointment. Not keeping the appointment could result in a much worse infection, chronic or permanent injury, pain, and disability. If there is any problem keeping the appointment, you must call back to this facility for assistance.  SEEK MEDICAL CARE IF:   You develop increased pain, swelling, redness, drainage, or bleeding in the wound site.   You develop signs of generalized infection including muscle aches, chills, fever, or a general ill feeling.   You have an oral temperature above 102 F (38.9 C).  MAKE SURE YOU:   Understand these instructions.   Will watch your condition.   Will get help right away if you are not doing well or get worse.  Document  Released: 11/06/2004 Document Revised: 10/09/2010 Document Reviewed: 08/31/2007 [facility] Patient Information 2012 [place], [place].

## 2012-02-28 NOTE — ED Notes (Signed)
Pt states "I might be able to sleep without pain tonight!"

## 2012-02-28 NOTE — ED Notes (Signed)
Pt c/o abscess to right buttocks; pt sts painful and red area noted

## 2012-02-28 NOTE — ED Notes (Signed)
CBG 76; Cyndia Skeeters, PA notified about CBG and verified pt stable for d/c at this time.

## 2012-02-28 NOTE — ED Notes (Signed)
Pt states pain remains the same. Pt laying on stomach in bed. Pt denies needs currently.

## 2012-02-28 NOTE — ED Notes (Signed)
Cyndia Skeeters, PA and PA student at bedside. States just irrigated wound.

## 2012-08-31 DIAGNOSIS — F259 Schizoaffective disorder, unspecified: Secondary | ICD-10-CM | POA: Diagnosis not present

## 2013-01-11 DIAGNOSIS — F259 Schizoaffective disorder, unspecified: Secondary | ICD-10-CM | POA: Diagnosis not present

## 2013-07-11 DIAGNOSIS — F319 Bipolar disorder, unspecified: Secondary | ICD-10-CM | POA: Diagnosis not present

## 2014-01-10 DIAGNOSIS — F419 Anxiety disorder, unspecified: Secondary | ICD-10-CM | POA: Diagnosis not present

## 2014-01-10 DIAGNOSIS — F4310 Post-traumatic stress disorder, unspecified: Secondary | ICD-10-CM | POA: Diagnosis not present

## 2014-01-10 DIAGNOSIS — F319 Bipolar disorder, unspecified: Secondary | ICD-10-CM | POA: Diagnosis not present

## 2014-01-10 DIAGNOSIS — Z5181 Encounter for therapeutic drug level monitoring: Secondary | ICD-10-CM | POA: Diagnosis not present

## 2014-01-10 DIAGNOSIS — Z79899 Other long term (current) drug therapy: Secondary | ICD-10-CM | POA: Diagnosis not present

## 2014-03-08 DIAGNOSIS — F319 Bipolar disorder, unspecified: Secondary | ICD-10-CM | POA: Diagnosis not present

## 2014-03-09 DIAGNOSIS — F319 Bipolar disorder, unspecified: Secondary | ICD-10-CM | POA: Diagnosis not present

## 2014-03-13 DIAGNOSIS — F319 Bipolar disorder, unspecified: Secondary | ICD-10-CM | POA: Diagnosis not present

## 2014-03-15 DIAGNOSIS — F319 Bipolar disorder, unspecified: Secondary | ICD-10-CM | POA: Diagnosis not present

## 2014-03-16 DIAGNOSIS — F319 Bipolar disorder, unspecified: Secondary | ICD-10-CM | POA: Diagnosis not present

## 2014-03-20 DIAGNOSIS — F259 Schizoaffective disorder, unspecified: Secondary | ICD-10-CM | POA: Diagnosis not present

## 2014-03-22 DIAGNOSIS — F259 Schizoaffective disorder, unspecified: Secondary | ICD-10-CM | POA: Diagnosis not present

## 2014-03-23 DIAGNOSIS — F259 Schizoaffective disorder, unspecified: Secondary | ICD-10-CM | POA: Diagnosis not present

## 2014-03-28 DIAGNOSIS — F259 Schizoaffective disorder, unspecified: Secondary | ICD-10-CM | POA: Diagnosis not present

## 2014-03-29 DIAGNOSIS — F259 Schizoaffective disorder, unspecified: Secondary | ICD-10-CM | POA: Diagnosis not present

## 2014-03-30 DIAGNOSIS — F259 Schizoaffective disorder, unspecified: Secondary | ICD-10-CM | POA: Diagnosis not present

## 2014-04-05 DIAGNOSIS — F259 Schizoaffective disorder, unspecified: Secondary | ICD-10-CM | POA: Diagnosis not present

## 2014-04-06 DIAGNOSIS — F259 Schizoaffective disorder, unspecified: Secondary | ICD-10-CM | POA: Diagnosis not present

## 2014-04-10 DIAGNOSIS — F259 Schizoaffective disorder, unspecified: Secondary | ICD-10-CM | POA: Diagnosis not present

## 2014-04-12 DIAGNOSIS — F259 Schizoaffective disorder, unspecified: Secondary | ICD-10-CM | POA: Diagnosis not present

## 2014-04-13 DIAGNOSIS — F259 Schizoaffective disorder, unspecified: Secondary | ICD-10-CM | POA: Diagnosis not present

## 2014-04-17 DIAGNOSIS — F259 Schizoaffective disorder, unspecified: Secondary | ICD-10-CM | POA: Diagnosis not present

## 2014-04-19 DIAGNOSIS — F259 Schizoaffective disorder, unspecified: Secondary | ICD-10-CM | POA: Diagnosis not present

## 2014-04-20 DIAGNOSIS — F259 Schizoaffective disorder, unspecified: Secondary | ICD-10-CM | POA: Diagnosis not present

## 2014-04-24 DIAGNOSIS — F259 Schizoaffective disorder, unspecified: Secondary | ICD-10-CM | POA: Diagnosis not present

## 2014-04-26 DIAGNOSIS — F259 Schizoaffective disorder, unspecified: Secondary | ICD-10-CM | POA: Diagnosis not present

## 2014-04-27 DIAGNOSIS — F259 Schizoaffective disorder, unspecified: Secondary | ICD-10-CM | POA: Diagnosis not present

## 2014-05-01 DIAGNOSIS — F259 Schizoaffective disorder, unspecified: Secondary | ICD-10-CM | POA: Diagnosis not present

## 2014-05-03 DIAGNOSIS — F259 Schizoaffective disorder, unspecified: Secondary | ICD-10-CM | POA: Diagnosis not present

## 2014-05-04 DIAGNOSIS — F259 Schizoaffective disorder, unspecified: Secondary | ICD-10-CM | POA: Diagnosis not present

## 2014-05-11 DIAGNOSIS — F259 Schizoaffective disorder, unspecified: Secondary | ICD-10-CM | POA: Diagnosis not present

## 2014-05-15 DIAGNOSIS — F259 Schizoaffective disorder, unspecified: Secondary | ICD-10-CM | POA: Diagnosis not present

## 2014-05-17 DIAGNOSIS — F259 Schizoaffective disorder, unspecified: Secondary | ICD-10-CM | POA: Diagnosis not present

## 2014-05-18 DIAGNOSIS — F259 Schizoaffective disorder, unspecified: Secondary | ICD-10-CM | POA: Diagnosis not present

## 2014-05-22 DIAGNOSIS — F259 Schizoaffective disorder, unspecified: Secondary | ICD-10-CM | POA: Diagnosis not present

## 2014-05-24 DIAGNOSIS — F259 Schizoaffective disorder, unspecified: Secondary | ICD-10-CM | POA: Diagnosis not present

## 2014-06-06 ENCOUNTER — Ambulatory Visit (INDEPENDENT_AMBULATORY_CARE_PROVIDER_SITE_OTHER): Payer: Medicare Other | Admitting: Family Medicine

## 2014-06-06 ENCOUNTER — Encounter: Payer: Self-pay | Admitting: Family Medicine

## 2014-06-06 VITALS — BP 126/80 | HR 84 | Resp 18 | Ht 76.0 in | Wt 163.0 lb

## 2014-06-06 DIAGNOSIS — Z79899 Other long term (current) drug therapy: Secondary | ICD-10-CM

## 2014-06-06 DIAGNOSIS — R05 Cough: Secondary | ICD-10-CM | POA: Diagnosis not present

## 2014-06-06 DIAGNOSIS — F319 Bipolar disorder, unspecified: Secondary | ICD-10-CM

## 2014-06-06 DIAGNOSIS — R059 Cough, unspecified: Secondary | ICD-10-CM | POA: Insufficient documentation

## 2014-06-06 DIAGNOSIS — F17200 Nicotine dependence, unspecified, uncomplicated: Secondary | ICD-10-CM

## 2014-06-06 DIAGNOSIS — I10 Essential (primary) hypertension: Secondary | ICD-10-CM | POA: Diagnosis not present

## 2014-06-06 DIAGNOSIS — R4781 Slurred speech: Secondary | ICD-10-CM

## 2014-06-06 DIAGNOSIS — E559 Vitamin D deficiency, unspecified: Secondary | ICD-10-CM

## 2014-06-06 DIAGNOSIS — J302 Other seasonal allergic rhinitis: Secondary | ICD-10-CM | POA: Insufficient documentation

## 2014-06-06 DIAGNOSIS — F17210 Nicotine dependence, cigarettes, uncomplicated: Secondary | ICD-10-CM

## 2014-06-06 DIAGNOSIS — IMO0001 Reserved for inherently not codable concepts without codable children: Secondary | ICD-10-CM

## 2014-06-06 DIAGNOSIS — R0789 Other chest pain: Secondary | ICD-10-CM

## 2014-06-06 MED ORDER — MOMETASONE FUROATE 50 MCG/ACT NA SUSP: 2.0000 | Freq: Every day | NASAL | Status: DC

## 2014-06-06 NOTE — Assessment & Plan Note (Signed)

## 2014-06-06 NOTE — Progress Notes (Signed)
Subjective:    Patient ID: Justin Russo, male    DOB: 07/22/1980, 34 y.o.   MRN: [id number]  HPI   Justin Russo     MRN: [id number]      DOB: 09/14/1980   HPI Mr. Justin Russo is here for follow up , to re establish care and re-evaluation of chronic medical conditions, medication management and review of any available recent lab and radiology data. Lab data overdue and needs to be done Preventive health is updated, specifically  Immunization.   Questions or concerns regarding consultations or procedures which the PT has had in the interim are  Addressed.Followed by psychiatry regularly and has remained in good health.The PT denies any adverse reactions to current medications since the last visit.  Has changed to healthier lifestyle, runs daily, has changed his diet, lost weight and trying to stop smoking He c/o occasional chest pain and because of heart diesease in his f/h he wants to know about this. Pain has no specific aggravating o relieving factors, non radiating, no associated nausea or  Diaphoresis or ligth headedness, duration less than 2 minutes, started in the past approx 3 months, on avg twice per month C/o dry hacking cough  ROS Denies recent fever or chills. C/o increased  sinus pressure, nasal congestion, denies ear pain or sore throat. D Denies chest pains, palpitations and leg swelling Denies abdominal pain, nausea, vomiting,diarrhea or constipation.   Denies dysuria, frequency, hesitancy or incontinence. Denies joint pain, swelling and limitation in mobility. Denies headaches, seizures, numbness, or tingling. Denies uncontrolled depression, anxiety or insomnia. Denies skin break down or rash.   PE  BP 126/80 mmHg  Pulse 84  Resp 18  Ht 6\' 4"  (1.93 m)  Wt 163 lb 0.6 oz (73.954 kg)  BMI 19.85 kg/m2  SpO2 98%  Patient alert and oriented and in no cardiopulmonary distress.  HEENT: No facial asymmetry, EOMI,   oropharynx pink and moist.  Neck supple no JVD, no  mass.  Chest: Clear to auscultation bilaterally.Decreased though adequate air entry, no reproducible chest wall pain CVS: S1, S2 no murmurs, no S3.Regular rate. EKG: NSR, no LVH, no ischemic changes ABD: Soft non tender.   Ext: No edema  MS: Adequate ROM spine, shoulders, hips and knees.  Skin: Intact, no ulcerations or rash noted.  Psych: Good eye contact,  Memory iimpaired not anxious or depressed appearing.  CNS: CN 2-12 intact, power,  normal throughout.no focal deficits noted.   Assessment & Plan   NICOTINE ADDICTION Patient counseled for approximately 5 minutes regarding the health risks of ongoing nicotine use, specifically all types of cancer, heart disease, stroke and respiratory failure. The options available for help with cessation ,the behavioral changes to assist the process, and the option to either gradully reduce usage  Or abruptly stop.is also discussed. Pt is also encouraged to set specific goals in number of cigarettes used daily, as well as to set a quit date.  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 The importance of attaining a healthy weight is also discussed.  BP/Weight 06/06/2014 02/28/2012 09/06/2011 03/31/2011  Systolic BP 126 120 101 130  Diastolic BP 80 80  68 84  Wt. (Lbs) 163.04 - - 156.4  BMI 19.85 - - 19.94        Chronic brain injury Pt has impairment in speech and is unable to work and support himself as a result of this. He is living independently, however his father overseas him      Review of Systems     Objective:   Physical Exam        Assessment & Plan:

## 2014-06-06 NOTE — Patient Instructions (Signed)
Annual wellness in 5 month, call if you need me before  Fasting lipid, cmp, TSH, Vit D and HIV , CBC, asap   Please continue to commit to stopping drug and cigarette use and to be healthy  1800 quit NOW  Number can be called 24 hours per day  [facility]  EKG today shows no heart damage  Nose spray to help with allergies is sent in  Keep running  All the best  Thanks for choosing [facility], we consider it a privelige to serve you.

## 2014-06-12 DIAGNOSIS — F319 Bipolar disorder, unspecified: Secondary | ICD-10-CM | POA: Diagnosis not present

## 2014-06-23 ENCOUNTER — Encounter: Payer: Self-pay | Admitting: Family Medicine

## 2014-06-28 DIAGNOSIS — F259 Schizoaffective disorder, unspecified: Secondary | ICD-10-CM | POA: Diagnosis not present

## 2014-06-28 DIAGNOSIS — Z79899 Other long term (current) drug therapy: Secondary | ICD-10-CM | POA: Diagnosis not present

## 2014-06-29 DIAGNOSIS — F259 Schizoaffective disorder, unspecified: Secondary | ICD-10-CM | POA: Diagnosis not present

## 2014-06-29 DIAGNOSIS — Z79899 Other long term (current) drug therapy: Secondary | ICD-10-CM | POA: Diagnosis not present

## 2014-07-04 DIAGNOSIS — Z79899 Other long term (current) drug therapy: Secondary | ICD-10-CM | POA: Diagnosis not present

## 2014-07-04 DIAGNOSIS — F259 Schizoaffective disorder, unspecified: Secondary | ICD-10-CM | POA: Diagnosis not present

## 2014-07-29 ENCOUNTER — Encounter: Payer: Self-pay | Admitting: Family Medicine

## 2014-07-29 DIAGNOSIS — S069XAS Unspecified intracranial injury with loss of consciousness status unknown, sequela: Secondary | ICD-10-CM | POA: Insufficient documentation

## 2014-07-29 DIAGNOSIS — S069X9S Unspecified intracranial injury with loss of consciousness of unspecified duration, sequela: Secondary | ICD-10-CM | POA: Insufficient documentation

## 2014-07-29 DIAGNOSIS — R0789 Other chest pain: Secondary | ICD-10-CM | POA: Insufficient documentation

## 2014-07-29 NOTE — Assessment & Plan Note (Signed)
Stable, managed by psych 

## 2014-07-29 NOTE — Assessment & Plan Note (Signed)
Controlled, no change in medication Psychiatry treats patient

## 2014-07-29 NOTE — Assessment & Plan Note (Signed)
Chronic nicotine use and cough, cXR ordered

## 2014-07-29 NOTE — Assessment & Plan Note (Signed)
Controlled, no change in medication DASH diet and commitment to daily physical activity for a minimum of 30 minutes discussed and encouraged, as a part of hypertension management. The importance of attaining a healthy weight is also discussed.  BP/Weight 06/06/2014 02/28/2012 09/06/2011 03/31/2011  Systolic BP 126 120 101 130  Diastolic BP 80 80 68 84  Wt. (Lbs) 163.04 - - 156.4  BMI 19.85 - - 19.94

## 2014-07-29 NOTE — Assessment & Plan Note (Signed)
Uncontrolled, reports excess nasal conhestion and post nasal drainage , resume dailynasal spray

## 2014-07-29 NOTE — Assessment & Plan Note (Signed)
Intermittent chest pain, positive f/h of CAd , and ongoing nicotine use, EKG in office, NSR , no evidence of ischemia or LVH, normal

## 2014-07-29 NOTE — Assessment & Plan Note (Signed)
Pt has impairment in speech and is unable to work and support himself as a result of this. He is living independently, however his father overseas him

## 2014-07-29 NOTE — Assessment & Plan Note (Signed)
Controlled on mediccation

## 2014-09-12 DIAGNOSIS — F4310 Post-traumatic stress disorder, unspecified: Secondary | ICD-10-CM | POA: Diagnosis not present

## 2014-09-15 DIAGNOSIS — F4310 Post-traumatic stress disorder, unspecified: Secondary | ICD-10-CM | POA: Diagnosis not present

## 2014-10-17 DIAGNOSIS — F250 Schizoaffective disorder, bipolar type: Secondary | ICD-10-CM | POA: Diagnosis not present

## 2014-10-18 DIAGNOSIS — F250 Schizoaffective disorder, bipolar type: Secondary | ICD-10-CM | POA: Diagnosis not present

## 2014-10-20 DIAGNOSIS — F250 Schizoaffective disorder, bipolar type: Secondary | ICD-10-CM | POA: Diagnosis not present

## 2014-10-25 DIAGNOSIS — F250 Schizoaffective disorder, bipolar type: Secondary | ICD-10-CM | POA: Diagnosis not present

## 2014-10-27 DIAGNOSIS — F250 Schizoaffective disorder, bipolar type: Secondary | ICD-10-CM | POA: Diagnosis not present

## 2014-10-30 DIAGNOSIS — F250 Schizoaffective disorder, bipolar type: Secondary | ICD-10-CM | POA: Diagnosis not present

## 2014-11-03 DIAGNOSIS — F250 Schizoaffective disorder, bipolar type: Secondary | ICD-10-CM | POA: Diagnosis not present

## 2014-11-20 DIAGNOSIS — F310 Bipolar disorder, current episode hypomanic: Secondary | ICD-10-CM | POA: Diagnosis not present

## 2014-11-24 DIAGNOSIS — F310 Bipolar disorder, current episode hypomanic: Secondary | ICD-10-CM | POA: Diagnosis not present

## 2014-11-28 DIAGNOSIS — F310 Bipolar disorder, current episode hypomanic: Secondary | ICD-10-CM | POA: Diagnosis not present

## 2014-11-29 DIAGNOSIS — F310 Bipolar disorder, current episode hypomanic: Secondary | ICD-10-CM | POA: Diagnosis not present

## 2014-11-30 DIAGNOSIS — F310 Bipolar disorder, current episode hypomanic: Secondary | ICD-10-CM | POA: Diagnosis not present

## 2014-12-01 DIAGNOSIS — F310 Bipolar disorder, current episode hypomanic: Secondary | ICD-10-CM | POA: Diagnosis not present

## 2014-12-04 DIAGNOSIS — F310 Bipolar disorder, current episode hypomanic: Secondary | ICD-10-CM | POA: Diagnosis not present

## 2014-12-05 DIAGNOSIS — F310 Bipolar disorder, current episode hypomanic: Secondary | ICD-10-CM | POA: Diagnosis not present

## 2014-12-06 ENCOUNTER — Encounter: Payer: Medicare Other | Admitting: Family Medicine

## 2014-12-06 DIAGNOSIS — F310 Bipolar disorder, current episode hypomanic: Secondary | ICD-10-CM | POA: Diagnosis not present

## 2014-12-07 DIAGNOSIS — F310 Bipolar disorder, current episode hypomanic: Secondary | ICD-10-CM | POA: Diagnosis not present

## 2014-12-08 DIAGNOSIS — F310 Bipolar disorder, current episode hypomanic: Secondary | ICD-10-CM | POA: Diagnosis not present

## 2014-12-11 DIAGNOSIS — F310 Bipolar disorder, current episode hypomanic: Secondary | ICD-10-CM | POA: Diagnosis not present

## 2014-12-12 DIAGNOSIS — F310 Bipolar disorder, current episode hypomanic: Secondary | ICD-10-CM | POA: Diagnosis not present

## 2014-12-13 DIAGNOSIS — F310 Bipolar disorder, current episode hypomanic: Secondary | ICD-10-CM | POA: Diagnosis not present

## 2014-12-14 DIAGNOSIS — F310 Bipolar disorder, current episode hypomanic: Secondary | ICD-10-CM | POA: Diagnosis not present

## 2014-12-15 DIAGNOSIS — F310 Bipolar disorder, current episode hypomanic: Secondary | ICD-10-CM | POA: Diagnosis not present

## 2014-12-18 DIAGNOSIS — F4310 Post-traumatic stress disorder, unspecified: Secondary | ICD-10-CM | POA: Diagnosis not present

## 2014-12-21 DIAGNOSIS — F4310 Post-traumatic stress disorder, unspecified: Secondary | ICD-10-CM | POA: Diagnosis not present

## 2014-12-22 DIAGNOSIS — F4310 Post-traumatic stress disorder, unspecified: Secondary | ICD-10-CM | POA: Diagnosis not present

## 2014-12-25 DIAGNOSIS — F4310 Post-traumatic stress disorder, unspecified: Secondary | ICD-10-CM | POA: Diagnosis not present

## 2014-12-25 DIAGNOSIS — F310 Bipolar disorder, current episode hypomanic: Secondary | ICD-10-CM | POA: Diagnosis not present

## 2015-03-07 ENCOUNTER — Telehealth: Payer: Self-pay

## 2015-03-07 DIAGNOSIS — I10 Essential (primary) hypertension: Secondary | ICD-10-CM

## 2015-03-07 DIAGNOSIS — Z1321 Encounter for screening for nutritional disorder: Secondary | ICD-10-CM

## 2015-03-07 DIAGNOSIS — Z114 Encounter for screening for human immunodeficiency virus [HIV]: Secondary | ICD-10-CM

## 2015-03-07 NOTE — Telephone Encounter (Signed)
Lab order given to father

## 2016-06-02 DIAGNOSIS — F411 Generalized anxiety disorder: Secondary | ICD-10-CM | POA: Diagnosis not present

## 2016-06-02 DIAGNOSIS — F319 Bipolar disorder, unspecified: Secondary | ICD-10-CM | POA: Diagnosis not present

## 2016-06-08 ENCOUNTER — Emergency Department (HOSPITAL_COMMUNITY)
Admission: EM | Admit: 2016-06-08 | Discharge: 2016-06-08 | Disposition: A | Discharge status: Home / Self Care | Payer: Medicare Other | Attending: Emergency Medicine | Admitting: Emergency Medicine

## 2016-06-08 ENCOUNTER — Encounter (HOSPITAL_COMMUNITY): Payer: Self-pay | Admitting: *Deleted

## 2016-06-08 DIAGNOSIS — Z79899 Other long term (current) drug therapy: Secondary | ICD-10-CM | POA: Insufficient documentation

## 2016-06-08 DIAGNOSIS — F10920 Alcohol use, unspecified with intoxication, uncomplicated: Secondary | ICD-10-CM

## 2016-06-08 DIAGNOSIS — F17210 Nicotine dependence, cigarettes, uncomplicated: Secondary | ICD-10-CM | POA: Diagnosis not present

## 2016-06-08 DIAGNOSIS — R569 Unspecified convulsions: Secondary | ICD-10-CM

## 2016-06-08 DIAGNOSIS — F10120 Alcohol abuse with intoxication, uncomplicated: Secondary | ICD-10-CM | POA: Diagnosis not present

## 2016-06-08 LAB — CBC WITH DIFFERENTIAL/PLATELET
Basophils Absolute: 0 10*3/uL (ref 0.0–0.1)
Basophils Relative: 0 %
EOS ABS: 0.4 10*3/uL (ref 0.0–0.7)
EOS PCT: 5 %
HCT: 42.5 % (ref 39.0–52.0)
HEMOGLOBIN: 13.9 g/dL (ref 13.0–17.0)
LYMPHS ABS: 3.6 10*3/uL (ref 0.7–4.0)
Lymphocytes Relative: 49 %
MCH: 28.8 pg (ref 26.0–34.0)
MCHC: 32.7 g/dL (ref 30.0–36.0)
MCV: 88.2 fL (ref 78.0–100.0)
MONOS PCT: 9 %
Monocytes Absolute: 0.6 10*3/uL (ref 0.1–1.0)
NEUTROS PCT: 37 %
Neutro Abs: 2.8 10*3/uL (ref 1.7–7.7)
PLATELETS: 196 10*3/uL (ref 150–400)
RBC: 4.82 MIL/uL (ref 4.22–5.81)
RDW: 14 % (ref 11.5–15.5)
WBC: 7.5 10*3/uL (ref 4.0–10.5)

## 2016-06-08 LAB — I-STAT CHEM 8, ED
BUN: 16 mg/dL (ref 6–20)
CALCIUM ION: 1.03 mmol/L — AB (ref 1.15–1.40)
Chloride: 109 mmol/L (ref 101–111)
Creatinine, Ser: 1.4 mg/dL — ABNORMAL HIGH (ref 0.61–1.24)
Glucose, Bld: 91 mg/dL (ref 65–99)
HCT: 42 % (ref 39.0–52.0)
Hemoglobin: 14.3 g/dL (ref 13.0–17.0)
Potassium: 4 mmol/L (ref 3.5–5.1)
SODIUM: 145 mmol/L (ref 135–145)
TCO2: 25 mmol/L (ref 0–100)

## 2016-06-08 LAB — COMPREHENSIVE METABOLIC PANEL
ALBUMIN: 3.9 g/dL (ref 3.5–5.0)
ALK PHOS: 52 U/L (ref 38–126)
ALT: 23 U/L (ref 17–63)
ANION GAP: 9 (ref 5–15)
AST: 22 U/L (ref 15–41)
BUN: 14 mg/dL (ref 6–20)
CALCIUM: 8.7 mg/dL — AB (ref 8.9–10.3)
CHLORIDE: 110 mmol/L (ref 101–111)
CO2: 25 mmol/L (ref 22–32)
Creatinine, Ser: 1.1 mg/dL (ref 0.61–1.24)
GFR calc non Af Amer: >60 mL/min (ref >60)
GLUCOSE: 93 mg/dL (ref 65–99)
POTASSIUM: 4.1 mmol/L (ref 3.5–5.1)
SODIUM: 144 mmol/L (ref 135–145)
Total Bilirubin: 0.3 mg/dL (ref 0.3–1.2)
Total Protein: 6.4 g/dL — ABNORMAL LOW (ref 6.5–8.1)

## 2016-06-08 LAB — VALPROIC ACID LEVEL

## 2016-06-08 LAB — ETHANOL: Alcohol, Ethyl (B): 133 mg/dL — ABNORMAL HIGH (ref <5)

## 2016-06-08 MED ORDER — DIVALPROEX SODIUM ER 500 MG PO TB24: 500.0000 mg | ORAL_TABLET | Freq: Three times a day (TID) | ORAL | 0 refills | Status: DC

## 2016-06-08 MED ORDER — DIVALPROEX SODIUM ER 500 MG PO TB24: 1500.0000 mg | ORAL_TABLET | Freq: Every day | ORAL | 0 refills | Status: DC

## 2016-06-08 MED ORDER — VALPROIC ACID 250 MG PO CAPS: 500.0000 mg | ORAL_CAPSULE | Freq: Once | ORAL | Status: DC

## 2016-06-08 MED ORDER — DIVALPROEX SODIUM 250 MG PO DR TAB: 500.0000 mg | DELAYED_RELEASE_TABLET | Freq: Once | ORAL | Status: DC

## 2016-06-08 MED ORDER — DIVALPROEX SODIUM ER 500 MG PO TB24
1500.0000 mg | ORAL_TABLET | Freq: Once | ORAL | Status: DC
  Filled 2016-06-08: qty 3

## 2016-06-08 MED ORDER — DIVALPROEX SODIUM ER 500 MG PO TB24
500.0000 mg | ORAL_TABLET | Freq: Once | ORAL | Status: AC
  Administered 2016-06-08: 500 mg via ORAL
  Filled 2016-06-08: qty 1

## 2016-06-08 NOTE — ED Triage Notes (Signed)
The pt has been drinking all dayt and night he was lying on the couch when he had a seizure according to ems his friend called them  He has a tbi and his speech  Is slurred maybe normal for him.    Alert no tongue damage no incontinence of bowel or bladder

## 2016-06-08 NOTE — ED Notes (Signed)
The pt is sleepy at present.  Seizure pads applied to the stretcher.  Stretcher in the lowest position

## 2016-06-08 NOTE — ED Notes (Signed)
Unable to understand his speech
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
## 2016-08-29 DIAGNOSIS — Z79899 Other long term (current) drug therapy: Secondary | ICD-10-CM | POA: Diagnosis not present

## 2016-08-29 DIAGNOSIS — F310 Bipolar disorder, current episode hypomanic: Secondary | ICD-10-CM | POA: Diagnosis not present

## 2017-01-13 ENCOUNTER — Encounter: Payer: Self-pay | Admitting: Family Medicine

## 2017-08-05 ENCOUNTER — Emergency Department (HOSPITAL_COMMUNITY)
Admission: EM | Admit: 2017-08-05 | Discharge: 2017-08-05 | Disposition: A | Discharge status: Home / Self Care | Payer: Medicare Other | Attending: Emergency Medicine | Admitting: Emergency Medicine

## 2017-08-05 ENCOUNTER — Emergency Department (HOSPITAL_COMMUNITY): Payer: Medicare Other

## 2017-08-05 ENCOUNTER — Encounter (HOSPITAL_COMMUNITY): Payer: Self-pay | Admitting: Emergency Medicine

## 2017-08-05 ENCOUNTER — Other Ambulatory Visit: Payer: Self-pay

## 2017-08-05 DIAGNOSIS — F17210 Nicotine dependence, cigarettes, uncomplicated: Secondary | ICD-10-CM | POA: Diagnosis not present

## 2017-08-05 DIAGNOSIS — R4182 Altered mental status, unspecified: Secondary | ICD-10-CM | POA: Insufficient documentation

## 2017-08-05 DIAGNOSIS — Z79899 Other long term (current) drug therapy: Secondary | ICD-10-CM | POA: Insufficient documentation

## 2017-08-05 DIAGNOSIS — Z711 Person with feared health complaint in whom no diagnosis is made: Secondary | ICD-10-CM | POA: Insufficient documentation

## 2017-08-05 LAB — CBC WITH DIFFERENTIAL/PLATELET
ABS IMMATURE GRANULOCYTES: 0 10*3/uL (ref 0.0–0.1)
BASOS ABS: 0.1 10*3/uL (ref 0.0–0.1)
Basophils Relative: 1 %
Eosinophils Absolute: 0.1 10*3/uL (ref 0.0–0.7)
Eosinophils Relative: 1 %
HEMATOCRIT: 45.6 % (ref 39.0–52.0)
Hemoglobin: 14.6 g/dL (ref 13.0–17.0)
Immature Granulocytes: 0 %
LYMPHS ABS: 2.7 10*3/uL (ref 0.7–4.0)
LYMPHS PCT: 33 %
MCH: 28.9 pg (ref 26.0–34.0)
MCHC: 32 g/dL (ref 30.0–36.0)
MCV: 90.3 fL (ref 78.0–100.0)
MONO ABS: 0.8 10*3/uL (ref 0.1–1.0)
MONOS PCT: 10 %
NEUTROS ABS: 4.5 10*3/uL (ref 1.7–7.7)
Neutrophils Relative %: 55 %
Platelets: 182 10*3/uL (ref 150–400)
RBC: 5.05 MIL/uL (ref 4.22–5.81)
RDW: 13.5 % (ref 11.5–15.5)
WBC: 8.2 10*3/uL (ref 4.0–10.5)

## 2017-08-05 LAB — COMPREHENSIVE METABOLIC PANEL
ALBUMIN: 4.3 g/dL (ref 3.5–5.0)
ALT: 26 U/L (ref 0–44)
ANION GAP: 8 (ref 5–15)
AST: 31 U/L (ref 15–41)
Alkaline Phosphatase: 50 U/L (ref 38–126)
BUN: 26 mg/dL — AB (ref 6–20)
CO2: 23 mmol/L (ref 22–32)
Calcium: 9.4 mg/dL (ref 8.9–10.3)
Chloride: 107 mmol/L (ref 98–111)
Creatinine, Ser: 1.31 mg/dL — ABNORMAL HIGH (ref 0.61–1.24)
GFR calc Af Amer: >60 mL/min (ref >60)
GFR calc non Af Amer: >60 mL/min (ref >60)
GLUCOSE: 98 mg/dL (ref 70–99)
POTASSIUM: 4.1 mmol/L (ref 3.5–5.1)
Sodium: 138 mmol/L (ref 135–145)
TOTAL PROTEIN: 7.1 g/dL (ref 6.5–8.1)
Total Bilirubin: 1.2 mg/dL (ref 0.3–1.2)

## 2017-08-05 LAB — CBG MONITORING, ED: GLUCOSE-CAPILLARY: 84 mg/dL (ref 70–99)

## 2017-08-05 LAB — ETHANOL: Alcohol, Ethyl (B): <10 mg/dL (ref <10)

## 2017-08-05 LAB — AMMONIA: AMMONIA: 21 umol/L (ref 9–35)

## 2017-08-05 LAB — I-STAT CG4 LACTIC ACID, ED: Lactic Acid, Venous: 1.05 mmol/L (ref 0.5–1.9)

## 2017-08-05 MED ORDER — OLANZAPINE 10 MG PO TABS: 10.0000 mg | ORAL_TABLET | Freq: Every day | ORAL | 0 refills | Status: DC

## 2017-08-05 MED ORDER — DIVALPROEX SODIUM ER 500 MG PO TB24: 500.0000 mg | ORAL_TABLET | Freq: Every day | ORAL | 0 refills | Status: DC

## 2017-08-05 NOTE — Discharge Instructions (Signed)
Your lab work and head CT were normal.   The cause of your symptoms is unclear. However, it is very unlikely this is related to cancer in your brain. You should resume your medications for depression and bipolar, this may help.  Call your primary care doctor and make an appointment in the next 7-10 days for medication refills. You should return to your psychiatrist.   Return for fevers, severe headache, vision changes, changes to your speech, difficulty verbalizing your thoughts, difficulty with balance, chest pain, shortness of breath, one sided numbness or weakness

## 2017-08-05 NOTE — ED Triage Notes (Signed)
Patient to ED stating he thinks he has brain cancer. When asked what his symptoms are, he writes down on paper that he has changes in his thought process and when writing, he reports it is not normal for him, like he is putting things down that don't make sense. No last known well time. Slurred speech makes it difficult to understand patient - this is normal for him d/t previous brain injury. Denies current alcohol use. In no apparent distress at this time. Denies headache, vision changes, or new speech changes.

## 2017-08-05 NOTE — ED Notes (Signed)
Pt CBG was 84, notified Brooke(RN)

## 2017-08-05 NOTE — ED Provider Notes (Signed)
Patient placed in Quick Look pathway, seen and evaluated   Chief Complaint: ALOC  HPI:   Patient with hx ofg brain injru very difficult to understand. He has altered thought process, unsure if it is b/c of psych issues, brain "seizures" or brain"cancer"  ROS: abnormal thought process (one)  Physical Exam:   Gen: No distress  Neuro: Awake and Alert  Skin: Warm    Focused Exam: Speech impedicment   Initiation of care has begun. The patient has been counseled on the process, plan, and necessity for staying for the completion/evaluation, and the remainder of the medical screening examination    Arthor CaptainHarris, Tiphani Mells, PA-C 08/05/17 1516    Margarita Grizzleay, Danielle, MD 08/05/17 1701
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
## 2018-01-17 ENCOUNTER — Emergency Department (HOSPITAL_COMMUNITY): Payer: Medicare Other

## 2018-01-17 ENCOUNTER — Encounter (HOSPITAL_COMMUNITY): Payer: Self-pay | Admitting: Emergency Medicine

## 2018-01-17 ENCOUNTER — Emergency Department (HOSPITAL_COMMUNITY)
Admission: EM | Admit: 2018-01-17 | Discharge: 2018-01-17 | Disposition: A | Discharge status: Home / Self Care | Payer: Medicare Other | Attending: Emergency Medicine | Admitting: Emergency Medicine

## 2018-01-17 ENCOUNTER — Other Ambulatory Visit: Payer: Self-pay

## 2018-01-17 DIAGNOSIS — R1011 Right upper quadrant pain: Secondary | ICD-10-CM | POA: Insufficient documentation

## 2018-01-17 DIAGNOSIS — R109 Unspecified abdominal pain: Secondary | ICD-10-CM

## 2018-01-17 DIAGNOSIS — Z79899 Other long term (current) drug therapy: Secondary | ICD-10-CM | POA: Diagnosis not present

## 2018-01-17 DIAGNOSIS — I10 Essential (primary) hypertension: Secondary | ICD-10-CM | POA: Diagnosis not present

## 2018-01-17 DIAGNOSIS — F17210 Nicotine dependence, cigarettes, uncomplicated: Secondary | ICD-10-CM | POA: Insufficient documentation

## 2018-01-17 LAB — URINALYSIS, ROUTINE W REFLEX MICROSCOPIC
Bilirubin Urine: NEGATIVE
Glucose, UA: NEGATIVE mg/dL
Hgb urine dipstick: NEGATIVE
Ketones, ur: NEGATIVE mg/dL
Leukocytes, UA: NEGATIVE
Nitrite: NEGATIVE
Protein, ur: NEGATIVE mg/dL
SPECIFIC GRAVITY, URINE: 1.017 (ref 1.005–1.030)
pH: 5 (ref 5.0–8.0)

## 2018-01-17 MED ORDER — KETOROLAC TROMETHAMINE 60 MG/2ML IM SOLN
30.0000 mg | Freq: Once | INTRAMUSCULAR | Status: AC
  Administered 2018-01-17: 30 mg via INTRAMUSCULAR
  Filled 2018-01-17: qty 2
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
## 2018-01-17 NOTE — ED Triage Notes (Signed)
Patient complaining of flank pain on both sides. Patient is also having some dysuria. 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 Patient communicate with his phone.

## 2018-01-17 NOTE — Discharge Instructions (Addendum)
You may use over-the-counter Motrin (Ibuprofen), Acetaminophen (Tylenol), topical muscle creams such as SalonPas, Icy Hot, Bengay, etc. Please stretch, apply heat, and have massage therapy for additional assistance. ° °

## 2018-01-21 ENCOUNTER — Encounter (HOSPITAL_COMMUNITY): Payer: Self-pay | Admitting: Emergency Medicine

## 2018-01-21 ENCOUNTER — Emergency Department (HOSPITAL_COMMUNITY)
Admission: EM | Admit: 2018-01-21 | Discharge: 2018-01-21 | Disposition: A | Discharge status: Home / Self Care | Payer: Medicare Other | Attending: Emergency Medicine | Admitting: Emergency Medicine

## 2018-01-21 ENCOUNTER — Other Ambulatory Visit: Payer: Self-pay

## 2018-01-21 DIAGNOSIS — G259 Extrapyramidal and movement disorder, unspecified: Secondary | ICD-10-CM | POA: Insufficient documentation

## 2018-01-21 DIAGNOSIS — F17210 Nicotine dependence, cigarettes, uncomplicated: Secondary | ICD-10-CM | POA: Insufficient documentation

## 2018-01-21 DIAGNOSIS — T887XXA Unspecified adverse effect of drug or medicament, initial encounter: Secondary | ICD-10-CM

## 2018-01-21 DIAGNOSIS — T50995A Adverse effect of other drugs, medicaments and biological substances, initial encounter: Secondary | ICD-10-CM | POA: Diagnosis not present

## 2018-01-21 DIAGNOSIS — R251 Tremor, unspecified: Secondary | ICD-10-CM | POA: Diagnosis present

## 2018-01-21 DIAGNOSIS — Z87898 Personal history of other specified conditions: Secondary | ICD-10-CM

## 2018-01-21 DIAGNOSIS — Z87820 Personal history of traumatic brain injury: Secondary | ICD-10-CM | POA: Insufficient documentation

## 2018-01-21 DIAGNOSIS — F319 Bipolar disorder, unspecified: Secondary | ICD-10-CM | POA: Diagnosis not present
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
## 2018-01-21 NOTE — Discharge Instructions (Addendum)
STOP TAKING THE VRAYLAR and call your psychiatrist tomorrow for further instructions. The movements you had today are likely secondary to this medication.  If you develop similar symptoms again today, you may take 50mg  of benadryl. If you develop new or worsening symptoms or have other concerns please return to the ED.

## 2018-01-21 NOTE — ED Notes (Signed)
Patient verbalizes understanding of discharge instructions. Opportunity for questioning and answers were provided. Armband removed by staff, pt discharged from ED ambulatory.   

## 2018-01-21 NOTE — ED Notes (Signed)
Pt states he is having flu symptoms and would like to be tested for the flu. MD/PA notified
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
## 2018-02-25 ENCOUNTER — Ambulatory Visit (HOSPITAL_COMMUNITY)
Admission: EM | Admit: 2018-02-25 | Discharge: 2018-02-25 | Disposition: A | Discharge status: Home / Self Care | Payer: Medicare Other | Attending: Emergency Medicine | Admitting: Emergency Medicine

## 2018-02-25 ENCOUNTER — Other Ambulatory Visit: Payer: Self-pay

## 2018-02-25 ENCOUNTER — Encounter (HOSPITAL_COMMUNITY): Payer: Self-pay

## 2018-02-25 DIAGNOSIS — S61412A Laceration without foreign body of left hand, initial encounter: Secondary | ICD-10-CM | POA: Diagnosis not present

## 2018-02-25 MED ORDER — LIDOCAINE HCL (PF) 1 % IJ SOLN
INTRAMUSCULAR | Status: AC
  Filled 2018-02-25: qty 30
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
## 2018-02-25 NOTE — Discharge Instructions (Addendum)
You may take 600 mg of ibuprofen combined with 1 g of Tylenol as needed for pain.  Keep this clean and dry for the next 2 to 3 days.  Return here or see a primary care physician of your choice in 10 days to have the sutures removed.  Return sooner for any signs of infection.  Below is a list of primary care practices who are taking new patients for you to follow-up with. [facility] [address] [place], [place] [address] [phone]  [facility] [phone] [address] [place] [address]  [facility]: [address] [place] [address]  [phone]  [facility]: [address] [place] [address]   [phone]  [facility]: [address] [place] [address]  [phone]  [facility] : [address] [place] [address] [phone]  [facility]: [address] [place] [address] [phone]  [facility]: [address] [place] [address] [phone]  Dr. Oneal Grout [address] [facility] [place] [address]  [phone]  Dr. Jackie Plum, [facility]. [address] [place], [place] [address]  [phone]  Go to www.goodrx.com to look up your medications. This will give you a list of where you can find your prescriptions at the most affordable prices. Or ask the pharmacist what the cash price is, or if they have any other discount programs available to help make your medication more affordable. This can be less expensive than what you would pay with insurance.
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
## 2018-09-13 ENCOUNTER — Emergency Department (HOSPITAL_COMMUNITY)
Admission: EM | Admit: 2018-09-13 | Discharge: 2018-09-13 | Disposition: A | Discharge status: Home / Self Care | Payer: Medicare Other | Attending: Emergency Medicine | Admitting: Emergency Medicine

## 2018-09-13 ENCOUNTER — Encounter (HOSPITAL_COMMUNITY): Payer: Self-pay

## 2018-09-13 ENCOUNTER — Other Ambulatory Visit: Payer: Self-pay

## 2018-09-13 DIAGNOSIS — L723 Sebaceous cyst: Secondary | ICD-10-CM | POA: Insufficient documentation

## 2018-09-13 DIAGNOSIS — Z79899 Other long term (current) drug therapy: Secondary | ICD-10-CM | POA: Insufficient documentation

## 2018-09-13 DIAGNOSIS — F17210 Nicotine dependence, cigarettes, uncomplicated: Secondary | ICD-10-CM | POA: Diagnosis not present

## 2018-09-13 DIAGNOSIS — H9202 Otalgia, left ear: Secondary | ICD-10-CM | POA: Diagnosis present

## 2018-09-13 MED ORDER — LIDOCAINE HCL 2 % IJ SOLN
20.0000 mL | Freq: Once | INTRAMUSCULAR | Status: DC
  Filled 2018-09-13: qty 20

## 2018-09-13 NOTE — Discharge Instructions (Signed)
Do not get area wet for 24 hours The glue will fall off on its own in about a week Do not put any lotion or cream on the area
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
## 2018-09-13 NOTE — ED Notes (Signed)
Patient verbalizes understanding of discharge instructions. Opportunity for questioning and answers were provided. Armband removed by staff, pt discharged from ED.  

## 2018-09-13 NOTE — ED Triage Notes (Signed)
Pt endorses abscess behind left ear x 2 months. No drainage and no other complaints. VSS.

## 2018-12-01 ENCOUNTER — Encounter (HOSPITAL_COMMUNITY): Payer: Self-pay

## 2018-12-01 ENCOUNTER — Emergency Department (HOSPITAL_COMMUNITY)
Admission: EM | Admit: 2018-12-01 | Discharge: 2018-12-01 | Disposition: A | Discharge status: Home / Self Care | Payer: Medicare Other | Attending: Emergency Medicine | Admitting: Emergency Medicine

## 2018-12-01 ENCOUNTER — Other Ambulatory Visit: Payer: Self-pay

## 2018-12-01 DIAGNOSIS — I10 Essential (primary) hypertension: Secondary | ICD-10-CM | POA: Diagnosis not present

## 2018-12-01 DIAGNOSIS — Z79899 Other long term (current) drug therapy: Secondary | ICD-10-CM | POA: Diagnosis not present

## 2018-12-01 DIAGNOSIS — F17210 Nicotine dependence, cigarettes, uncomplicated: Secondary | ICD-10-CM | POA: Insufficient documentation

## 2018-12-01 DIAGNOSIS — R0981 Nasal congestion: Secondary | ICD-10-CM | POA: Diagnosis present

## 2018-12-01 MED ORDER — LORATADINE 10 MG PO TABS: 10.0000 mg | ORAL_TABLET | Freq: Every day | ORAL | 0 refills | Status: AC

## 2018-12-01 MED ORDER — FLUTICASONE PROPIONATE 50 MCG/ACT NA SUSP: 1.0000 | Freq: Every day | NASAL | 2 refills | Status: AC

## 2018-12-01 NOTE — ED Triage Notes (Signed)
Pt reports 2 weeks of nasal congestion. Slight cough, no fever.

## 2018-12-01 NOTE — Discharge Instructions (Signed)
Please read attached information. If you experience any new or worsening signs or symptoms please return to the emergency room for evaluation. Please follow-up with your primary care provider or specialist as discussed. Please use medication prescribed only as directed and discontinue taking if you have any concerning signs or symptoms.   °
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
## 2019-02-08 ENCOUNTER — Other Ambulatory Visit: Payer: Self-pay

## 2019-02-08 ENCOUNTER — Emergency Department (HOSPITAL_COMMUNITY)
Admission: EM | Admit: 2019-02-08 | Discharge: 2019-02-09 | Discharge status: Against Medical Advice | Payer: Medicare Other | Attending: Emergency Medicine | Admitting: Emergency Medicine

## 2019-02-08 ENCOUNTER — Encounter (HOSPITAL_COMMUNITY): Payer: Self-pay | Admitting: Emergency Medicine

## 2019-02-08 DIAGNOSIS — Y92019 Unspecified place in single-family (private) house as the place of occurrence of the external cause: Secondary | ICD-10-CM | POA: Diagnosis not present

## 2019-02-08 DIAGNOSIS — Y939 Activity, unspecified: Secondary | ICD-10-CM | POA: Insufficient documentation

## 2019-02-08 DIAGNOSIS — Y999 Unspecified external cause status: Secondary | ICD-10-CM | POA: Insufficient documentation

## 2019-02-08 DIAGNOSIS — Z5321 Procedure and treatment not carried out due to patient leaving prior to being seen by health care provider: Secondary | ICD-10-CM | POA: Insufficient documentation

## 2019-02-08 DIAGNOSIS — T07XXXA Unspecified multiple injuries, initial encounter: Secondary | ICD-10-CM | POA: Diagnosis present
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
## 2019-02-09 NOTE — ED Notes (Signed)
No answer for VS x1 

## 2019-02-09 NOTE — ED Notes (Signed)
No answer for VS x 2 

## 2019-02-25 ENCOUNTER — Other Ambulatory Visit: Payer: Self-pay | Admitting: Critical Care Medicine

## 2019-02-25 DIAGNOSIS — Z20822 Contact with and (suspected) exposure to covid-19: Secondary | ICD-10-CM

## 2019-02-26 LAB — NOVEL CORONAVIRUS, NAA: SARS-CoV-2, NAA: NOT DETECTED

## 2019-03-18 ENCOUNTER — Other Ambulatory Visit: Payer: Self-pay

## 2019-03-18 DIAGNOSIS — Z20822 Contact with and (suspected) exposure to covid-19: Secondary | ICD-10-CM

## 2019-03-20 LAB — NOVEL CORONAVIRUS, NAA: SARS-CoV-2, NAA: NOT DETECTED

## 2020-07-30 DIAGNOSIS — F17210 Nicotine dependence, cigarettes, uncomplicated: Secondary | ICD-10-CM | POA: Diagnosis not present

## 2020-07-30 DIAGNOSIS — F32A Depression, unspecified: Secondary | ICD-10-CM | POA: Diagnosis present

## 2020-07-30 DIAGNOSIS — I10 Essential (primary) hypertension: Secondary | ICD-10-CM | POA: Insufficient documentation

## 2020-07-30 DIAGNOSIS — R443 Hallucinations, unspecified: Secondary | ICD-10-CM | POA: Diagnosis not present

## 2020-07-30 DIAGNOSIS — F1210 Cannabis abuse, uncomplicated: Secondary | ICD-10-CM | POA: Insufficient documentation

## 2020-07-30 DIAGNOSIS — G4700 Insomnia, unspecified: Secondary | ICD-10-CM | POA: Diagnosis not present

## 2020-07-31 ENCOUNTER — Emergency Department (HOSPITAL_COMMUNITY)
Admission: EM | Admit: 2020-07-31 | Discharge: 2020-07-31 | Disposition: A | Discharge status: Home / Self Care | Payer: Medicare Other | Attending: Emergency Medicine | Admitting: Emergency Medicine

## 2020-07-31 ENCOUNTER — Emergency Department (HOSPITAL_COMMUNITY): Payer: Medicare Other

## 2020-07-31 ENCOUNTER — Other Ambulatory Visit: Payer: Self-pay

## 2020-07-31 DIAGNOSIS — F1210 Cannabis abuse, uncomplicated: Secondary | ICD-10-CM

## 2020-07-31 DIAGNOSIS — G4700 Insomnia, unspecified: Secondary | ICD-10-CM | POA: Diagnosis not present

## 2020-07-31 LAB — CBC WITH DIFFERENTIAL/PLATELET
Abs Immature Granulocytes: 0.04 10*3/uL (ref 0.00–0.07)
Basophils Absolute: 0.1 10*3/uL (ref 0.0–0.1)
Basophils Relative: 1 %
Eosinophils Absolute: 0.2 10*3/uL (ref 0.0–0.5)
Eosinophils Relative: 2 %
HCT: 42.4 % (ref 39.0–52.0)
Hemoglobin: 13.7 g/dL (ref 13.0–17.0)
Immature Granulocytes: 1 %
Lymphocytes Relative: 23 %
Lymphs Abs: 1.9 10*3/uL (ref 0.7–4.0)
MCH: 30.3 pg (ref 26.0–34.0)
MCHC: 32.3 g/dL (ref 30.0–36.0)
MCV: 93.8 fL (ref 80.0–100.0)
Monocytes Absolute: 0.8 10*3/uL (ref 0.1–1.0)
Monocytes Relative: 10 %
Neutro Abs: 5.2 10*3/uL (ref 1.7–7.7)
Neutrophils Relative %: 63 %
Platelets: 167 10*3/uL (ref 150–400)
RBC: 4.52 MIL/uL (ref 4.22–5.81)
RDW: 14 % (ref 11.5–15.5)
WBC: 8.2 10*3/uL (ref 4.0–10.5)
nRBC: 0 % (ref 0.0–0.2)

## 2020-07-31 LAB — RAPID URINE DRUG SCREEN, HOSP PERFORMED
Amphetamines: NOT DETECTED
Barbiturates: NOT DETECTED
Benzodiazepines: NOT DETECTED
Cocaine: NOT DETECTED
Opiates: NOT DETECTED
Tetrahydrocannabinol: POSITIVE — AB

## 2020-07-31 LAB — COMPREHENSIVE METABOLIC PANEL
ALT: 29 U/L (ref 0–44)
AST: 25 U/L (ref 15–41)
Albumin: 4 g/dL (ref 3.5–5.0)
Alkaline Phosphatase: 44 U/L (ref 38–126)
Anion gap: 10 (ref 5–15)
BUN: 15 mg/dL (ref 6–20)
CO2: 26 mmol/L (ref 22–32)
Calcium: 9 mg/dL (ref 8.9–10.3)
Chloride: 105 mmol/L (ref 98–111)
Creatinine, Ser: 0.98 mg/dL (ref 0.61–1.24)
GFR, Estimated: >60 mL/min (ref >60)
Glucose, Bld: 85 mg/dL (ref 70–99)
Potassium: 3.8 mmol/L (ref 3.5–5.1)
Sodium: 141 mmol/L (ref 135–145)
Total Bilirubin: 0.5 mg/dL (ref 0.3–1.2)
Total Protein: 6.7 g/dL (ref 6.5–8.1)

## 2020-07-31 LAB — ETHANOL: Alcohol, Ethyl (B): <10 mg/dL (ref <10)

## 2020-07-31 MED ORDER — HYDROXYZINE HCL 25 MG PO TABS: 25.0000 mg | ORAL_TABLET | Freq: Every evening | ORAL | 0 refills | Status: AC | PRN
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
## 2020-07-31 NOTE — ED Triage Notes (Signed)
Unsure what patient is here for.  Hard to understand patient.  Patient talking about a bite he got last June.  Also complains of weird dreams.  Denies si/hi  reports TBI.

## 2020-07-31 NOTE — ED Provider Notes (Signed)
Emergency Medicine Provider Triage Evaluation Note  Justin Russo , a 40 y.o. male  was evaluated in triage.  Pt complains of flea bite and leg pain.  Acute on chronic.  No immediately identifiable emergent medical condition. Medical Decision Making  Medically screening exam initiated at 1:28 AM.  Appropriate orders placed.  Justin Russo was informed that the remainder of the evaluation will be completed by another provider, this initial triage assessment does not replace that evaluation, and the importance of remaining in the ED until their evaluation is complete.     [name], PA-C 07/31/20 0129    Shon Baton, MD 07/31/20 [phone]
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
## 2021-02-28 ENCOUNTER — Emergency Department (HOSPITAL_COMMUNITY)
Admission: EM | Admit: 2021-02-28 | Discharge: 2021-03-01 | Disposition: A | Discharge status: Home / Self Care | Payer: Medicare Other | Attending: Emergency Medicine | Admitting: Emergency Medicine

## 2021-02-28 ENCOUNTER — Other Ambulatory Visit: Payer: Self-pay

## 2021-02-28 DIAGNOSIS — R519 Headache, unspecified: Secondary | ICD-10-CM | POA: Insufficient documentation

## 2021-02-28 DIAGNOSIS — R45851 Suicidal ideations: Secondary | ICD-10-CM | POA: Diagnosis not present

## 2021-02-28 DIAGNOSIS — Z20822 Contact with and (suspected) exposure to covid-19: Secondary | ICD-10-CM | POA: Insufficient documentation

## 2021-02-28 LAB — RESP PANEL BY RT-PCR (FLU A&B, COVID) ARPGX2
Influenza A by PCR: NEGATIVE
Influenza B by PCR: NEGATIVE
SARS Coronavirus 2 by RT PCR: NEGATIVE

## 2021-02-28 LAB — COMPREHENSIVE METABOLIC PANEL
ALT: 27 U/L (ref 0–44)
AST: 24 U/L (ref 15–41)
Albumin: 4.2 g/dL (ref 3.5–5.0)
Alkaline Phosphatase: 45 U/L (ref 38–126)
Anion gap: 8 (ref 5–15)
BUN: 17 mg/dL (ref 6–20)
CO2: 26 mmol/L (ref 22–32)
Calcium: 9.7 mg/dL (ref 8.9–10.3)
Chloride: 105 mmol/L (ref 98–111)
Creatinine, Ser: 1.08 mg/dL (ref 0.61–1.24)
GFR, Estimated: >60 mL/min (ref >60)
Glucose, Bld: 91 mg/dL (ref 70–99)
Potassium: 4.4 mmol/L (ref 3.5–5.1)
Sodium: 139 mmol/L (ref 135–145)
Total Bilirubin: 0.3 mg/dL (ref 0.3–1.2)
Total Protein: 6.9 g/dL (ref 6.5–8.1)

## 2021-02-28 LAB — DIFFERENTIAL
Abs Immature Granulocytes: 0.01 10*3/uL (ref 0.00–0.07)
Basophils Absolute: 0 10*3/uL (ref 0.0–0.1)
Basophils Relative: 1 %
Eosinophils Absolute: 0.1 10*3/uL (ref 0.0–0.5)
Eosinophils Relative: 2 %
Immature Granulocytes: 0 %
Lymphocytes Relative: 29 %
Lymphs Abs: 2.4 10*3/uL (ref 0.7–4.0)
Monocytes Absolute: 0.9 10*3/uL (ref 0.1–1.0)
Monocytes Relative: 11 %
Neutro Abs: 4.9 10*3/uL (ref 1.7–7.7)
Neutrophils Relative %: 57 %

## 2021-02-28 LAB — CBC
HCT: 43.3 % (ref 39.0–52.0)
Hemoglobin: 14 g/dL (ref 13.0–17.0)
MCH: 29.7 pg (ref 26.0–34.0)
MCHC: 32.3 g/dL (ref 30.0–36.0)
MCV: 91.7 fL (ref 80.0–100.0)
Platelets: 173 10*3/uL (ref 150–400)
RBC: 4.72 MIL/uL (ref 4.22–5.81)
RDW: 14 % (ref 11.5–15.5)
WBC: 8.4 10*3/uL (ref 4.0–10.5)
nRBC: 0 % (ref 0.0–0.2)

## 2021-02-28 LAB — ACETAMINOPHEN LEVEL: Acetaminophen (Tylenol), Serum: <10 ug/mL — ABNORMAL LOW (ref 10–30)

## 2021-02-28 LAB — URINALYSIS, ROUTINE W REFLEX MICROSCOPIC
Bilirubin Urine: NEGATIVE
Glucose, UA: NEGATIVE mg/dL
Hgb urine dipstick: NEGATIVE
Ketones, ur: NEGATIVE mg/dL
Leukocytes,Ua: NEGATIVE
Nitrite: NEGATIVE
Protein, ur: NEGATIVE mg/dL
Specific Gravity, Urine: 1.02 (ref 1.005–1.030)
pH: 5 (ref 5.0–8.0)

## 2021-02-28 LAB — ETHANOL: Alcohol, Ethyl (B): <10 mg/dL (ref <10)

## 2021-02-28 LAB — SALICYLATE LEVEL: Salicylate Lvl: <7 mg/dL — ABNORMAL LOW (ref 7.0–30.0)

## 2021-02-28 LAB — RAPID URINE DRUG SCREEN, HOSP PERFORMED
Amphetamines: NOT DETECTED
Barbiturates: NOT DETECTED
Benzodiazepines: NOT DETECTED
Cocaine: NOT DETECTED
Opiates: NOT DETECTED
Tetrahydrocannabinol: POSITIVE — AB
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
## 2021-02-28 NOTE — ED Triage Notes (Signed)
Pt c/o ongoing left side of his head that intermittent for the past 3 years following a TBI that reoccurs during this time of year each year. States concern for increasing pain and having weird thoughts concerning "the apocalypse." State he wishes he had "a gun to end it all." He endorses seeing soldiers from the civil war. C/o ongoing anxiety and depression. HX bipolar. States noncompliance with medications.

## 2021-03-01 ENCOUNTER — Emergency Department (HOSPITAL_COMMUNITY): Payer: Medicare Other

## 2021-03-01 DIAGNOSIS — R45851 Suicidal ideations: Secondary | ICD-10-CM | POA: Diagnosis not present

## 2021-03-01 DIAGNOSIS — R519 Headache, unspecified: Secondary | ICD-10-CM | POA: Diagnosis not present

## 2021-03-01 MED ORDER — LORAZEPAM 1 MG PO TABS: 1.0000 mg | ORAL_TABLET | Freq: Four times a day (QID) | ORAL | Status: DC | PRN

## 2021-03-01 MED ORDER — NICOTINE 21 MG/24HR TD PT24: 21.0000 mg | MEDICATED_PATCH | Freq: Every day | TRANSDERMAL | Status: DC

## 2021-03-01 MED ORDER — ONDANSETRON HCL 4 MG PO TABS: 4.0000 mg | ORAL_TABLET | Freq: Three times a day (TID) | ORAL | Status: DC | PRN

## 2021-03-01 NOTE — Discharge Instructions (Signed)
Discharge recommendations:  Please see information for follow-up appointment with psychiatry and therapy at the [facility] outpatient. Please call to schedule an appointment.   Therapy: We recommend that patient participate in individual therapy to address mental health concerns.  Safety:  The patient should abstain from use of illicit substances/drugs and abuse of any medications. If symptoms worsen or do not continue to improve or if the patient becomes actively suicidal or homicidal then it is recommended that the patient return to the closest hospital emergency department, the [facility], or call 911 for further evaluation and treatment. National Suicide Prevention Lifeline 1-800-SUICIDE or 343-238-3835.  About 988 988 offers 24/7 access to trained crisis counselors who can help people experiencing mental health-related distress. People can call or text 988 or chat 988lifeline.org for themselves or if they are worried about a loved one who may need crisis support.

## 2021-03-01 NOTE — BH Assessment (Signed)
Clinician messaged Bradly Bienenstock, RN; "Hey. It's Trey with TTS. Is the pt able to engage in the assessment, if so the pt will need to be placed in a private room. Also is the pt under IVC?"  Clinician awaiting response.    Redmond Pulling, MS, [facility], [place] [facility] Triage Specialist [phone]

## 2021-03-01 NOTE — ED Provider Notes (Signed)
°  Physical Exam  BP 101/86 (BP Location: Left Arm)    Pulse 74    Temp 98.5 F (36.9 C) (Oral)    Resp 18    Ht 6\' 3"  (1.905 m)    Wt 68 kg    SpO2 100%    BMI 18.75 kg/m   Physical Exam  Procedures  Procedures  ED Course / MDM    Medical Decision Making Amount and/or Complexity of Data Reviewed Labs: ordered. Radiology: ordered.  Risk OTC drugs. Prescription drug management.   Patient seen by psychiatry and cleared.  Seen by transitional care and given follow-up resources for homelessness.  Discharged       , MD 03/01/21 1353
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
## 2021-03-01 NOTE — ED Notes (Signed)
Resting at this time. No distress noted.  Visible rise and fall of chest.

## 2021-03-01 NOTE — ED Provider Notes (Signed)
[facility] EMERGENCY DEPARTMENT Provider Note   CSN: [id number] Arrival date & time: 02/28/21  1820     History  Chief Complaint  Patient presents with   Headache   Suicidal    Justin Russo is a 41 y.o. male.  Patient presents to the emergency department with complaints of feeling suicidal.  Patient also reports that he is having strange thoughts, concerned about the apocalypse.  Patient also reports a history of traumatic brain injury, has a history of recurrent headache and has been experiencing headache for the last few days.  Nothing significantly abnormal about his current headache.  He does have a history of bipolar disease, not currently taking his medications.      Home Medications Prior to Admission medications   Medication Sig Start Date End Date Taking? Authorizing Provider  fluticasone (FLONASE) 50 MCG/ACT nasal spray Place 1 spray into both nostrils daily. Patient not taking: Reported on 07/31/2020 12/01/18   Eyvonne Mechanic, PA-C  hydrOXYzine (ATARAX/VISTARIL) 25 MG tablet Take 1 tablet (25 mg total) by mouth at bedtime as needed (insomnia). Patient not taking: Reported on 02/28/2021 07/31/20   Eber Hong, MD  loratadine (CLARITIN) 10 MG tablet Take 1 tablet (10 mg total) by mouth daily. Patient not taking: Reported on 07/31/2020 12/01/18   Eyvonne Mechanic, PA-C      Allergies    Ambien [zolpidem tartrate] and Penicillins    Review of Systems   Review of Systems  Neurological:  Positive for headaches.  Psychiatric/Behavioral:  Positive for suicidal ideas.   All other systems reviewed and are negative.  Physical Exam Updated Vital Signs BP 133/80    Pulse 82    Temp 98 F (36.7 C)    Resp 16    Ht 6\' 3"  (1.905 m)    Wt 68 kg    SpO2 98%    BMI 18.75 kg/m  Physical Exam Vitals and nursing note reviewed.  Constitutional:      General: He is not in acute distress.    Appearance: Normal appearance. He is well-developed.  HENT:      Head: Normocephalic and atraumatic.     Right Ear: Hearing normal.     Left Ear: Hearing normal.     Nose: Nose normal.  Eyes:     Conjunctiva/sclera: Conjunctivae normal.     Pupils: Pupils are equal, round, and reactive to light.  Cardiovascular:     Rate and Rhythm: Regular rhythm.     Heart sounds: S1 normal and S2 normal. No murmur heard.   No friction rub. No gallop.  Pulmonary:     Effort: Pulmonary effort is normal. No respiratory distress.     Breath sounds: Normal breath sounds.  Chest:     Chest wall: No tenderness.  Abdominal:     General: Bowel sounds are normal.     Palpations: Abdomen is soft.     Tenderness: There is no abdominal tenderness. There is no guarding or rebound. Negative signs include Murphy's sign and McBurney's sign.     Hernia: No hernia is present.  Musculoskeletal:        General: Normal range of motion.     Cervical back: Normal range of motion and neck supple.  Skin:    General: Skin is warm and dry.     Findings: No rash.  Neurological:     Mental Status: He is alert and oriented to person, place, and time.     GCS: GCS eye  subscore is 4. GCS verbal subscore is 5. GCS motor subscore is 6.     Cranial Nerves: No cranial nerve deficit.     Sensory: No sensory deficit.     Coordination: Coordination normal.  Psychiatric:        Speech: Speech normal.        Behavior: Behavior normal.        Thought Content: Thought content includes suicidal ideation.    ED Results / Procedures / Treatments   Labs (all labs ordered are listed, but only abnormal results are displayed) Labs Reviewed  SALICYLATE LEVEL - Abnormal; Notable for the following components:      Result Value   Salicylate Lvl <7.0 (*)    All other components within normal limits  ACETAMINOPHEN LEVEL - Abnormal; Notable for the following components:   Acetaminophen (Tylenol), Serum <10 (*)    All other components within normal limits  RAPID URINE DRUG SCREEN, HOSP PERFORMED -  Abnormal; Notable for the following components:   Tetrahydrocannabinol POSITIVE (*)    All other components within normal limits  RESP PANEL BY RT-PCR (FLU A&B, COVID) ARPGX2  COMPREHENSIVE METABOLIC PANEL  ETHANOL  CBC  DIFFERENTIAL  URINALYSIS, ROUTINE W REFLEX MICROSCOPIC    EKG None  Radiology CT HEAD WO CONTRAST ( )  Result Date: 03/01/2021 CLINICAL DATA:  Chronic headaches EXAM: CT HEAD WITHOUT CONTRAST TECHNIQUE: Contiguous axial images were obtained from the base of the skull through the vertex without intravenous contrast. RADIATION DOSE REDUCTION: This exam was performed according to the departmental dose-optimization program which includes automated exposure control, adjustment of the mA and/or kV according to patient size and/or use of iterative reconstruction technique. COMPARISON:  07/31/2020 FINDINGS: Brain: Areas of encephalomalacia are again seen in the left frontal lobe and left temporal lobe consistent with prior infarct. No findings to suggest acute hemorrhage, acute infarction or space-occupying mass lesion are noted. Vascular: No hyperdense vessel or unexpected calcification. Skull: No acute fracture is noted. Stable expansile lesion in the right maxilla is noted dating back to 2019. Sinuses/Orbits: No acute finding. Other: None. IMPRESSION: No acute intracranial abnormality noted. 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 Electronically Signed   By: Alcide Clever M.D.   On: 03/01/2021 02:08    Procedures Procedures    Medications Ordered in ED Medications - No data to display  ED Course/ Medical Decision Making/ A&P                           Medical Decision Making Amount and/or Complexity of Data Reviewed Labs: ordered. Radiology: ordered.   Patient presents to the emergency department with headache and suicidal  ideation.  Patient has a history of traumatic brain injury and resultant chronic headaches.  CT head does not show any acute pathology.  Neurologic exam is otherwise normal.  Patient complaining of wanting to kill himself and therefore will require psychiatric evaluation.  Patient medically clear for psychiatric evaluation and treatment.         Final Clinical Impression(s) / ED Diagnoses Final diagnoses:  Bad headache  Suicidal ideations    Rx / DC Orders ED Discharge Orders     None         Devyon Keator, Canary Brim, MD 03/01/21 [phone]

## 2021-03-01 NOTE — ED Notes (Signed)
Patient up asking about plan, let him know waiting on TTS to re evaluate. Reports he has court on the 26th and wanted to know if he would still be here.

## 2021-03-01 NOTE — ED Notes (Signed)
TTS re eval is in progress

## 2021-03-05 ENCOUNTER — Other Ambulatory Visit: Payer: Self-pay

## 2021-03-05 ENCOUNTER — Emergency Department (HOSPITAL_COMMUNITY)
Admission: EM | Admit: 2021-03-05 | Discharge: 2021-03-06 | Disposition: A | Discharge status: Home / Self Care | Payer: Medicare Other | Attending: Emergency Medicine | Admitting: Emergency Medicine

## 2021-03-05 ENCOUNTER — Encounter (HOSPITAL_COMMUNITY): Payer: Self-pay | Admitting: Emergency Medicine

## 2021-03-05 DIAGNOSIS — M79671 Pain in right foot: Secondary | ICD-10-CM | POA: Diagnosis present

## 2021-03-05 NOTE — ED Provider Notes (Signed)
[facility] EMERGENCY DEPARTMENT Provider Note   CSN: [id number] Arrival date & time: 03/05/21  2156     History  Chief Complaint  Patient presents with   Foot Pain     Justin Russo is a 41 y.o. male.  HPI Patient is a 41 year old male presented emergency room today with right foot/heel pain that he states started approximately a week ago when he jumped across a mud puddle and landed on his heel he has had some achy pain since.  Denies any lacerations abrasions or punctures  Denies any ankle pain no other associate symptoms.    Home Medications Prior to Admission medications   Medication Sig Start Date End Date Taking? Authorizing Provider  fluticasone (FLONASE) 50 MCG/ACT nasal spray Place 1 spray into both nostrils daily. Patient not taking: Reported on 07/31/2020 12/01/18   Eyvonne Mechanic, PA-C  hydrOXYzine (ATARAX/VISTARIL) 25 MG tablet Take 1 tablet (25 mg total) by mouth at bedtime as needed (insomnia). Patient not taking: Reported on 02/28/2021 07/31/20   Eber Hong, MD  loratadine (CLARITIN) 10 MG tablet Take 1 tablet (10 mg total) by mouth daily. Patient not taking: Reported on 07/31/2020 12/01/18   Eyvonne Mechanic, PA-C      Allergies    Ambien [zolpidem tartrate] and Penicillins    Review of Systems   Review of Systems  Physical Exam Updated Vital Signs BP 115/66 (BP Location: Right Arm)    Pulse 76    Temp 98.4 F (36.9 C) (Oral)    Resp (!) 22    SpO2 97%  Physical Exam Vitals and nursing note reviewed.  Constitutional:      General: He is not in acute distress.    Appearance: Normal appearance. He is not ill-appearing.  HENT:     Head: Normocephalic and atraumatic.  Eyes:     General: No scleral icterus.       Right eye: No discharge.        Left eye: No discharge.     Conjunctiva/sclera: Conjunctivae normal.  Cardiovascular:     Rate and Rhythm: Normal rate.     Comments: Bilateral dorsalis pedis and posterior tibial pulse  3+ and symmetric Pulmonary:     Effort: Pulmonary effort is normal.     Breath sounds: No stridor.  Musculoskeletal:     Comments: No significant tenderness palpation of either lower extremity.  Skin:    General: Skin is warm and dry.     Comments: No skin lesions to bilateral heels or feet  Neurological:     Mental Status: He is alert and oriented to person, place, and time. Mental status is at baseline.    ED Results / Procedures / Treatments   Labs (all labs ordered are listed, but only abnormal results are displayed) Labs Reviewed - No data to display  EKG None  Radiology DG Foot Complete Right  Result Date: 03/06/2021 CLINICAL DATA:  Right foot injury and pain EXAM: RIGHT FOOT COMPLETE - 3+ VIEW COMPARISON:  None. FINDINGS: There is no evidence of fracture or dislocation. There is no evidence of arthropathy or other focal bone abnormality. Soft tissues are unremarkable. IMPRESSION: Negative. Electronically Signed   By: Helyn Numbers M.D.   On: 03/06/2021 02:21    Procedures Procedures    Medications Ordered in ED Medications - No data to display  ED Course/ Medical Decision Making/ A&P  Medical Decision Making Amount and/or Complexity of Data Reviewed Radiology: ordered.   Patient is a 41 year old male presented emergency room today with right foot/heel pain that he states started approximately a week ago when he jumped across a mud puddle and landed on his heel he has had some achy pain since.  Denies any lacerations abrasions or punctures  Denies any ankle pain no other associate symptoms.  I independently reviewed x-ray of right foot I have very low suspicion for occult fracture.  There is no obvious fracture on x-ray  Radiology read.  Patient is distally neurovascularly intact and I doubt any vascular abnormality given his bilateral symmetric pedal pulses being normal.  Good sensation-cap refill under 3 seconds his legs appear well  perfused and without any ulcers or evidence of infection cellulitis or abscess.  Will discharge home at this time.  Return precautions given he will follow-up with his PCP I provided him with the information for [facility] as well.  Final Clinical Impression(s) / ED Diagnoses Final diagnoses:  Right foot pain    Rx / DC Orders ED Discharge Orders     None         Gailen Shelter, [place] 03/06/21 0322    Dione Booze, MD 03/06/21 [phone]

## 2021-03-05 NOTE — ED Triage Notes (Signed)
Patient reports pain at right heel injured when he jumped and landed on his right foot last week /ambulatory .

## 2021-03-06 ENCOUNTER — Emergency Department (HOSPITAL_COMMUNITY): Payer: Medicare Other

## 2021-03-06 DIAGNOSIS — M79671 Pain in right foot: Secondary | ICD-10-CM | POA: Diagnosis not present

## 2021-03-06 NOTE — Discharge Instructions (Signed)
Your xray is negative for fracture.   Please use Tylenol or ibuprofen for pain.  You may use 600 mg ibuprofen every 6 hours or 1000 mg of Tylenol every 6 hours.  You may choose to alternate between the 2.  This would be most effective.  Not to exceed 4 g of Tylenol within 24 hours.  Not to exceed 3200 mg ibuprofen 24 hours.

## 2022-08-14 IMAGING — CT CT HEAD W/O CM
3 series · 15 of 47 positions shown, 18 images · non-contrast
Comparison: 07/31/2020

CLINICAL DATA: Chronic headaches



[Series 3: head 5.0 h30s · axial · 0.42mm/px · z∈[-129,+21]mm · 9 of 36 slices shown, 12 images]
[im 3/36  brain]
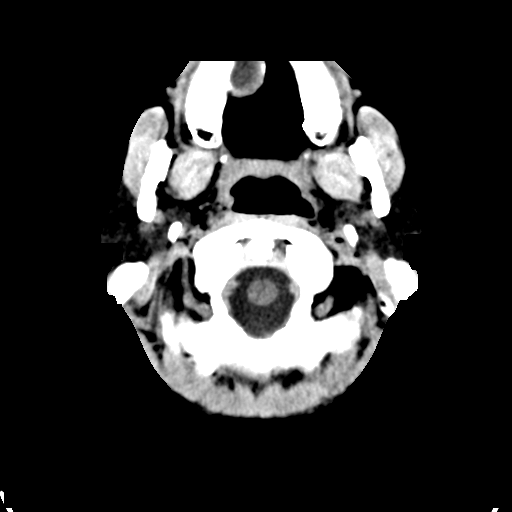
[im 3/36  bone]
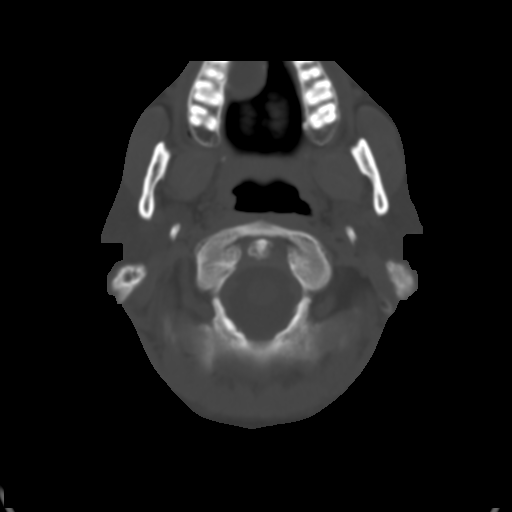
[im 7/36  brain]
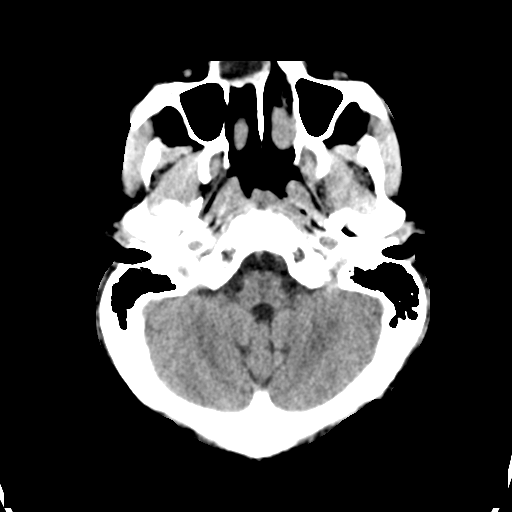
[im 10/36  brain]
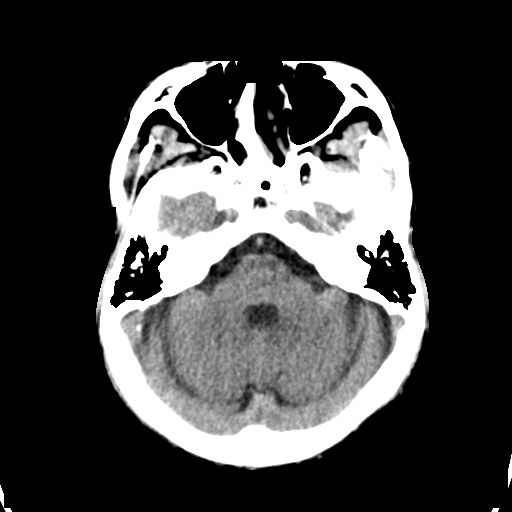
[im 14/36  brain]
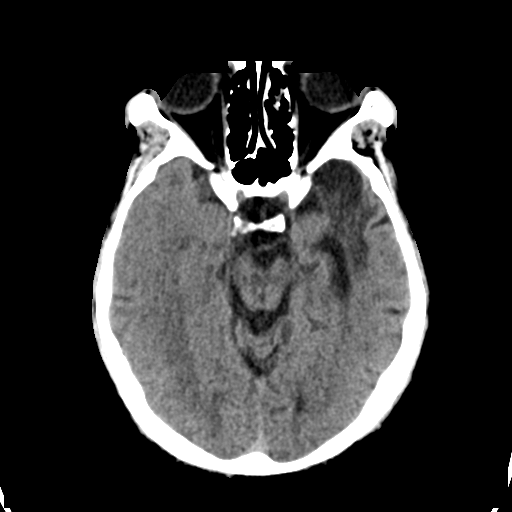
[im 19/36  brain]
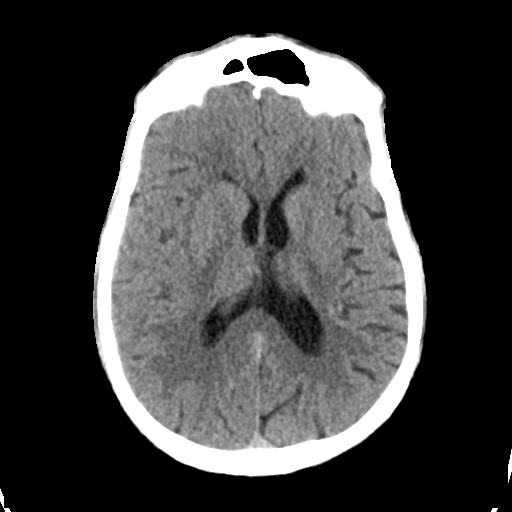
[im 19/36  bone]
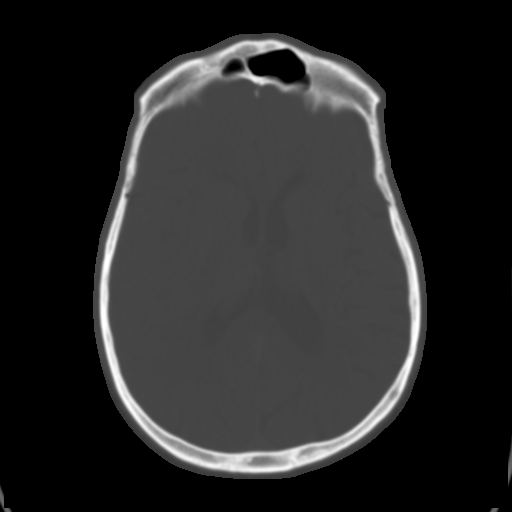
[im 22/36  brain]
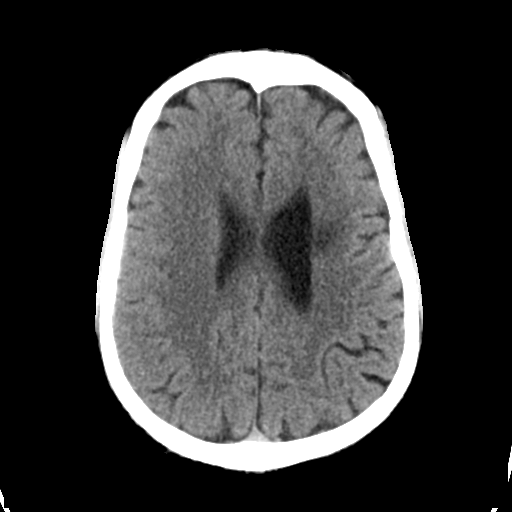
[im 26/36  brain]
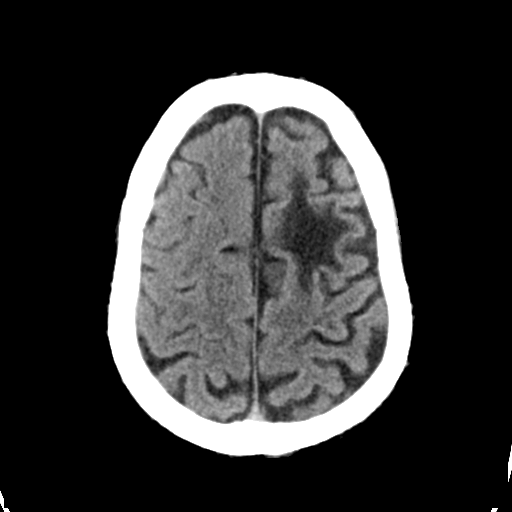
[im 29/36  brain]
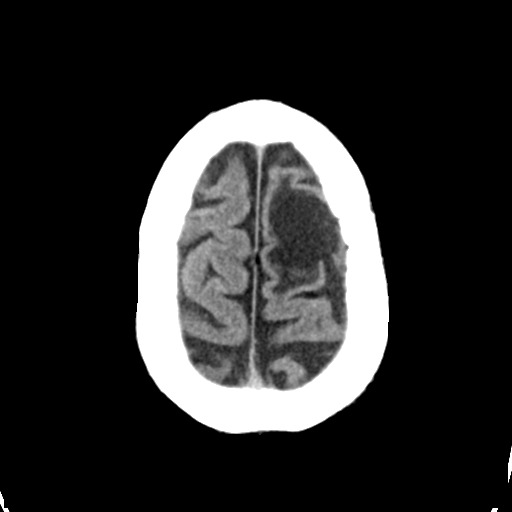
[im 33/36  brain]
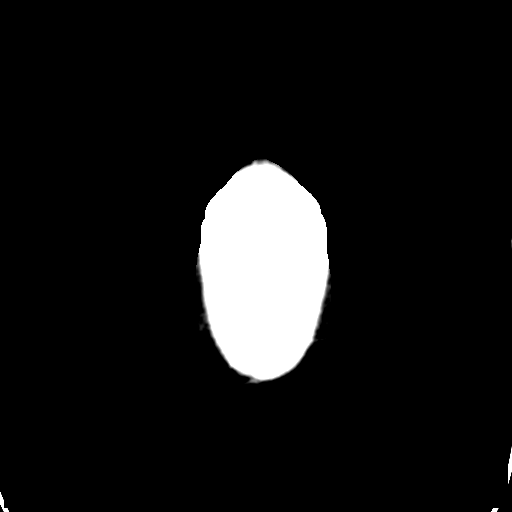
[im 33/36  bone]
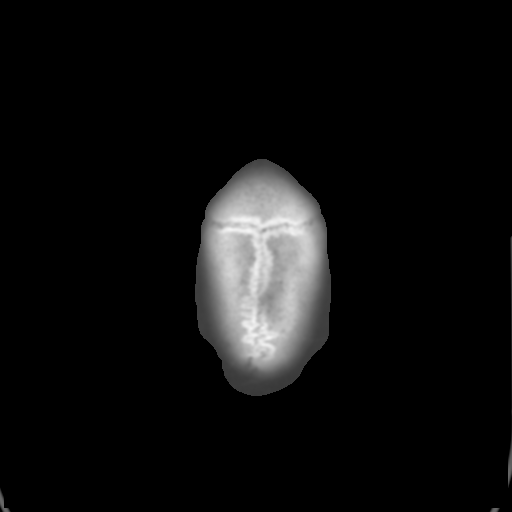

[Series 5: head 3.0 mpr cor · coronal · 0.32mm/px · 3 of 69 slices shown]
[im 23/69  brain]
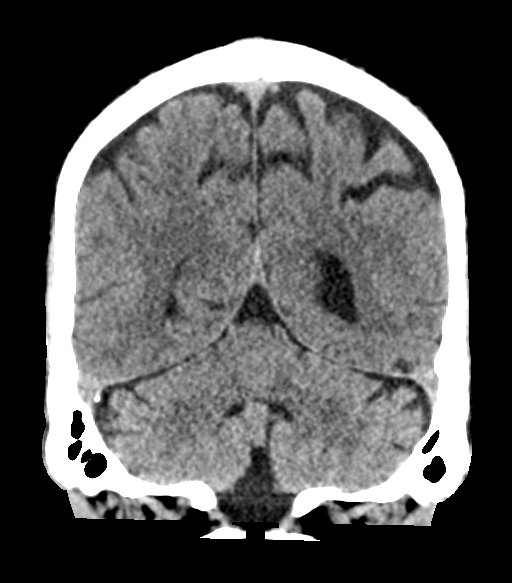
[im 31/69  brain]
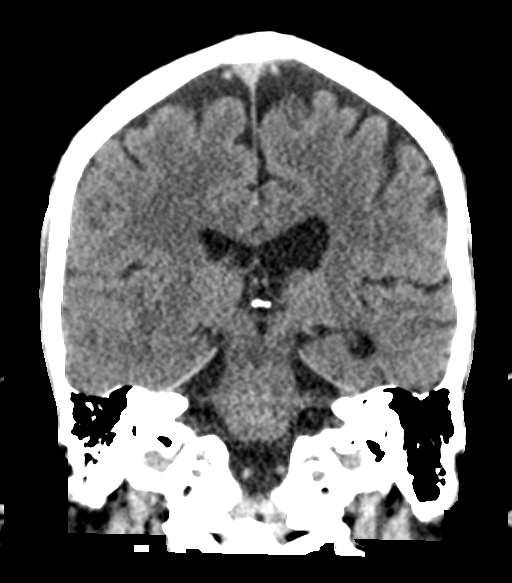
[im 38/69  brain]
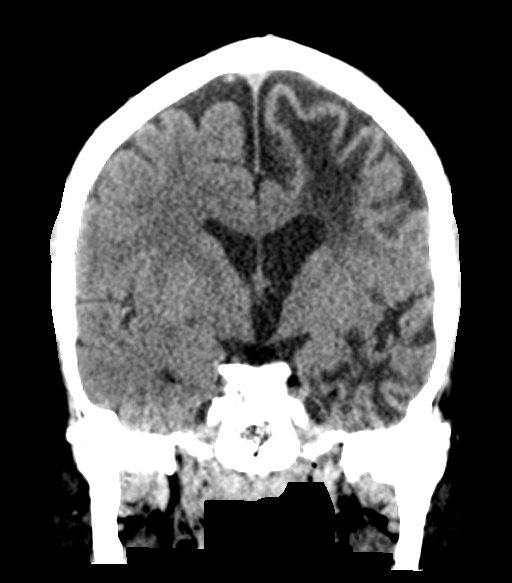

[Series 6: head 3.0 mpr sag · sagittal · 0.36mm/px · 3 of 56 slices shown]
[im 19/56  brain]
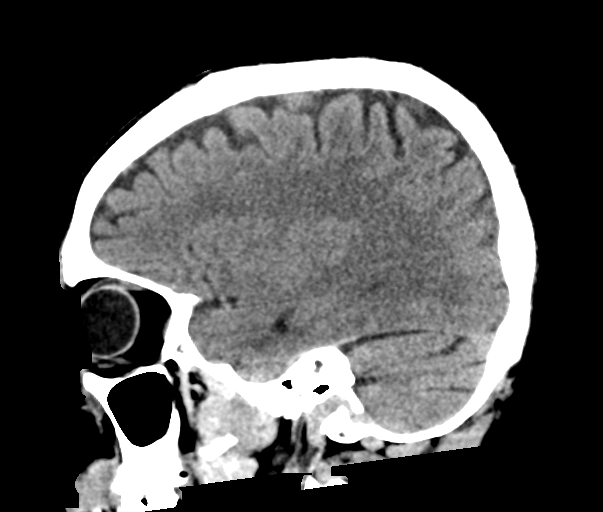
[im 28/56  brain]
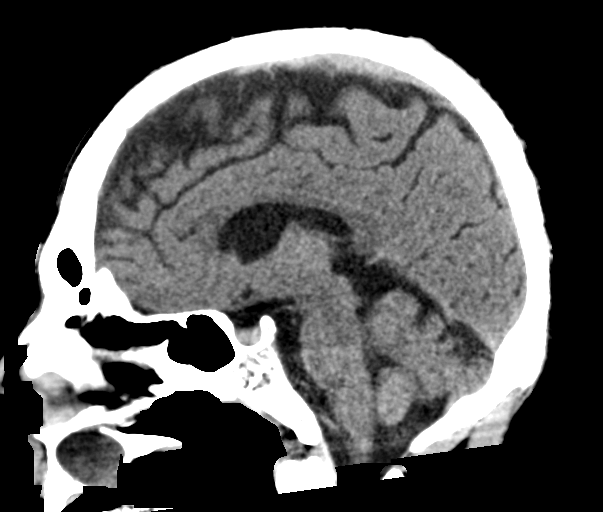
[im 37/56  brain]
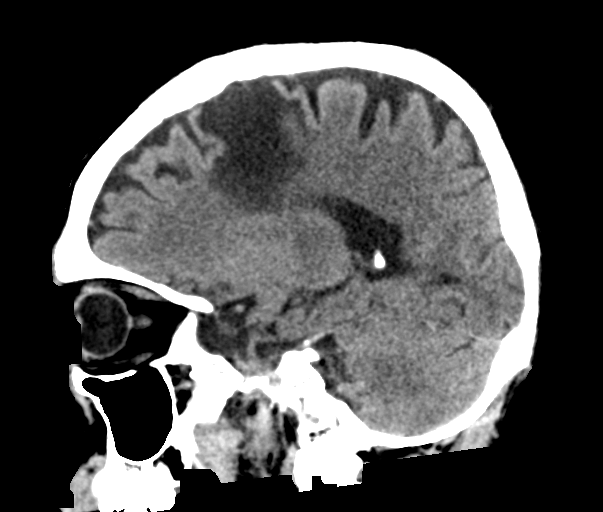

[15 of 47 positions shown; findings below may reference images not displayed]

FINDINGS: Brain: Areas of encephalomalacia are again seen in the left frontal
lobe and left temporal lobe consistent with prior infarct. No
findings to suggest acute hemorrhage, acute infarction or
space-occupying mass lesion are noted.

Vascular: No hyperdense vessel or unexpected calcification.

Skull: No acute fracture is noted. Stable expansile lesion in the
right maxilla is noted dating back to 5991.

Sinuses/Orbits: No acute finding.

Other: None.
IMPRESSION: No acute intracranial abnormality noted.

Changes of prior infarct with encephalomalacia as described.

Stable expansile lesion in the right maxilla unchanged from at least
5991 likely representing a dentigerous cyst or keratocyst.

## 2022-08-19 IMAGING — CR DG FOOT COMPLETE 3+V*R*
3 series · 3 of 3 positions shown · non-contrast
Comparison: None.

CLINICAL DATA: Right foot injury and pain

EXAM:
RIGHT FOOT COMPLETE - 3+ VIEW

[foot ap]
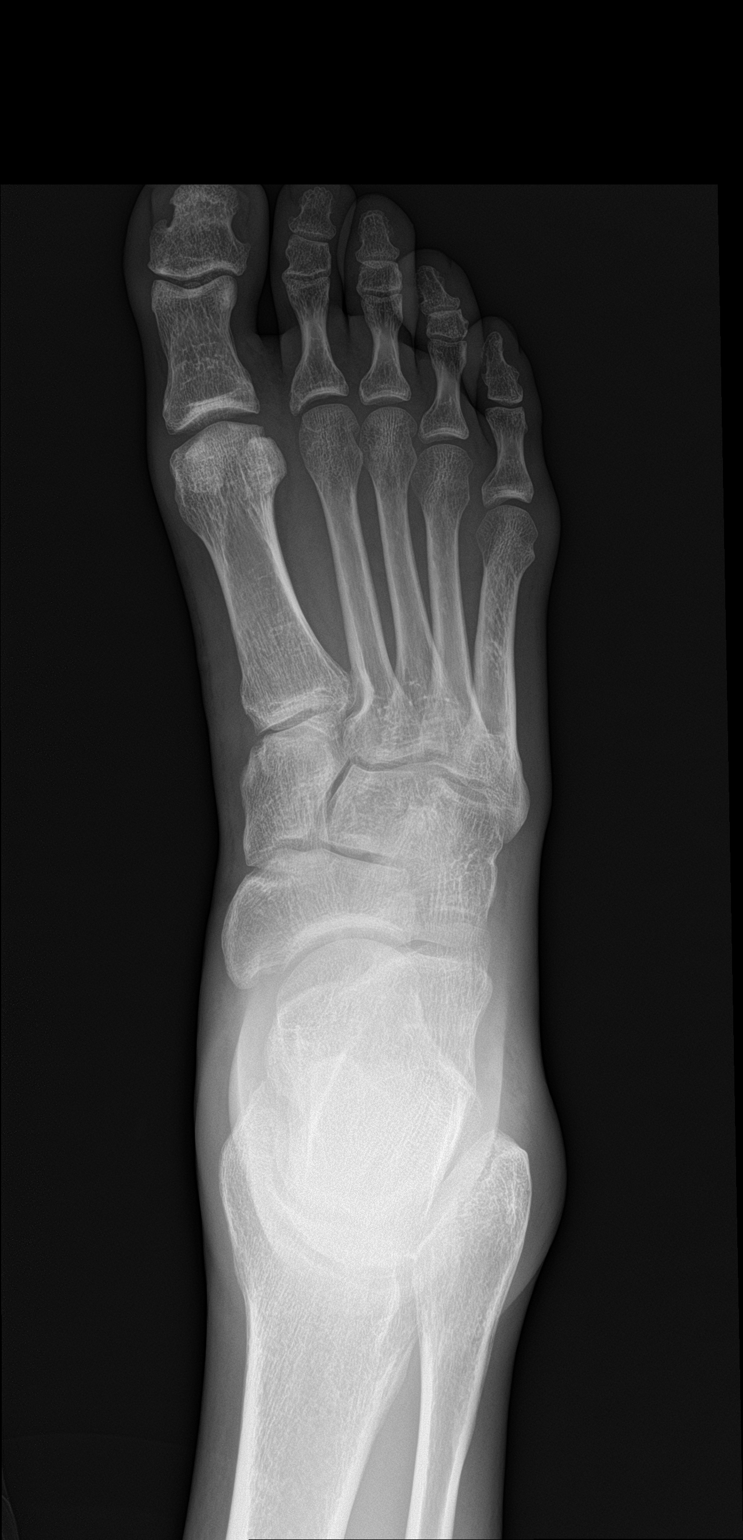

[foot obl]
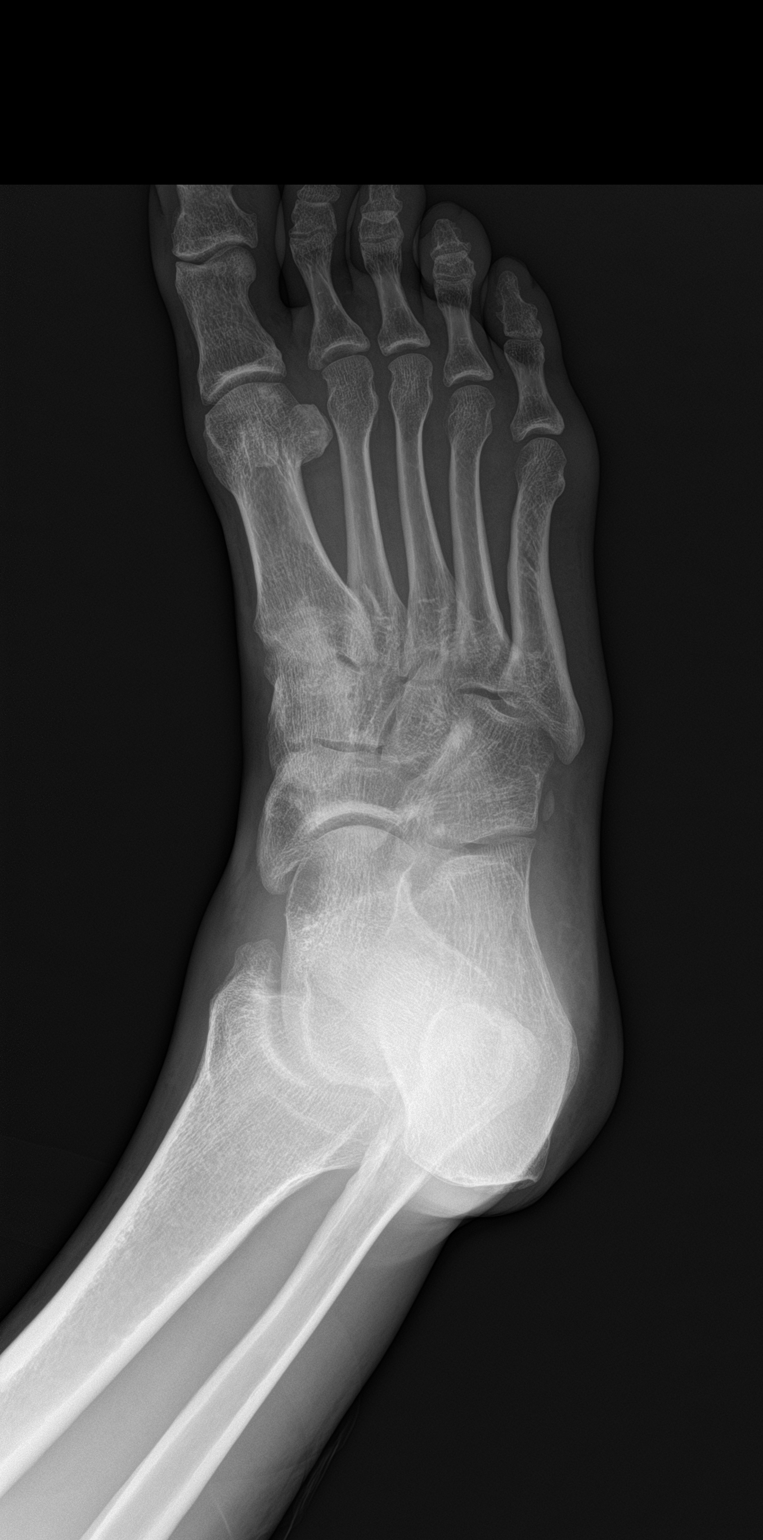

[foot lat]
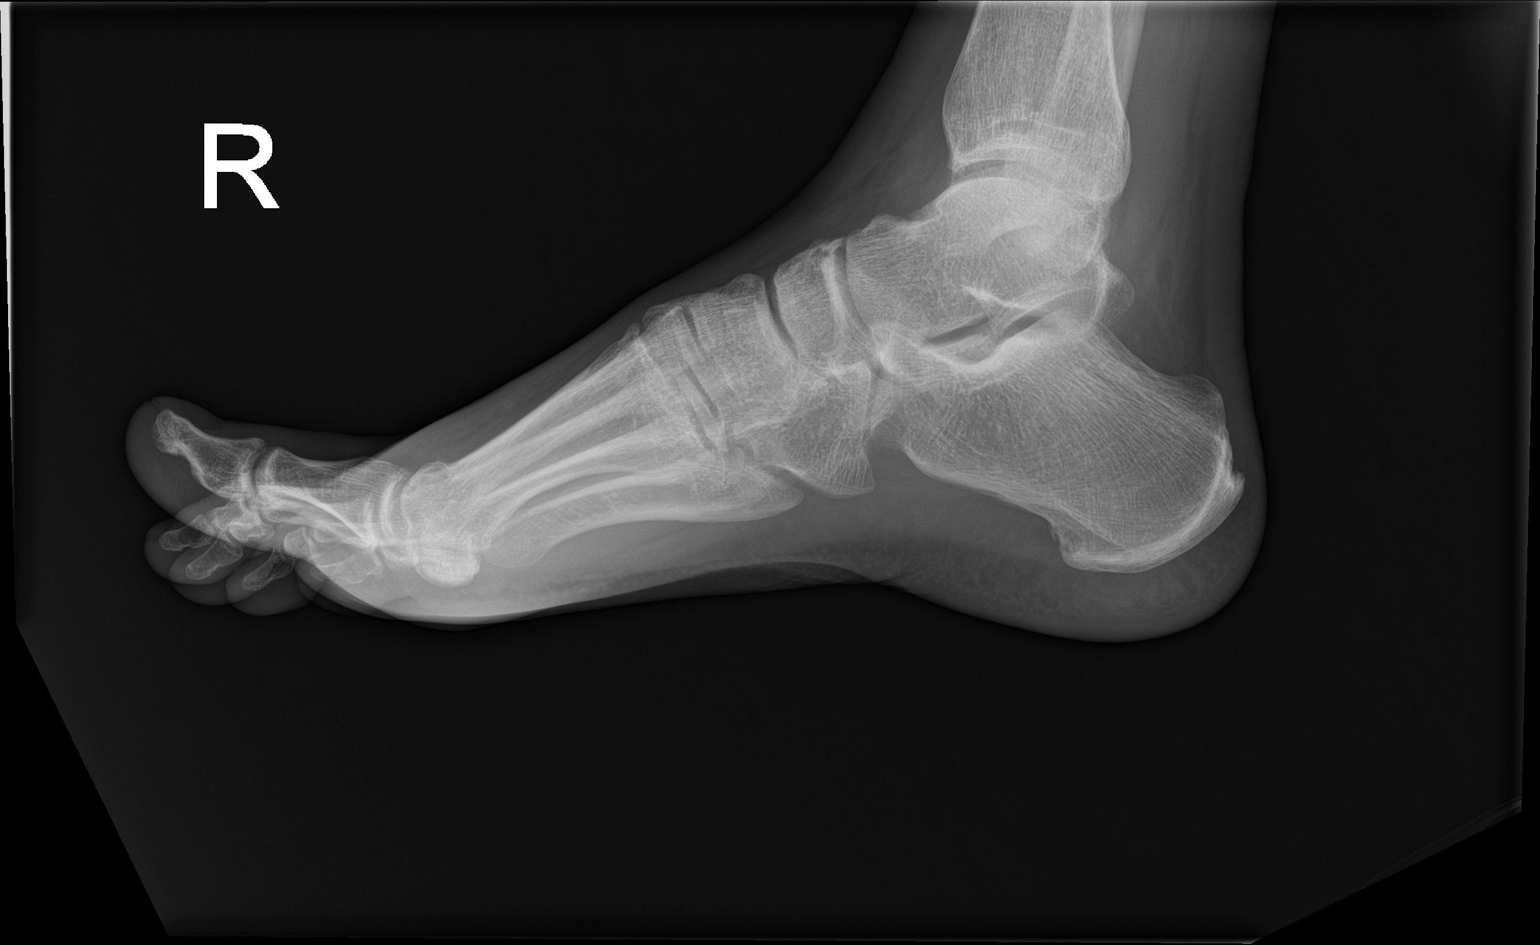

[3 of 3 positions shown; findings below may reference images not displayed]

FINDINGS: There is no evidence of fracture or dislocation. There is no
evidence of arthropathy or other focal bone abnormality. Soft
tissues are unremarkable.
IMPRESSION: Negative.
